# Patient Record
Sex: Female | Born: 1995 | Race: White | Hispanic: No | Smoking: Current every day smoker
Health system: Southern US, Community
[De-identification: ages and names within clinical notes are randomized; demographics above are authoritative.]

## PROBLEM LIST (undated history)

## (undated) DIAGNOSIS — F1990 Other psychoactive substance use, unspecified, uncomplicated: Secondary | ICD-10-CM

## (undated) DIAGNOSIS — R238 Other skin changes: Secondary | ICD-10-CM

## (undated) DIAGNOSIS — R233 Spontaneous ecchymoses: Secondary | ICD-10-CM

## (undated) HISTORY — PX: OTHER SURGICAL HISTORY: SHX169

## (undated) HISTORY — PX: NO PAST SURGERIES: SHX2092

## (undated) HISTORY — DX: Other skin changes: R23.8

## (undated) HISTORY — DX: Other psychoactive substance use, unspecified, uncomplicated: F19.90

## (undated) HISTORY — DX: Spontaneous ecchymoses: R23.3

---
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
## 2015-07-07 ENCOUNTER — Emergency Department
Admission: EM | Admit: 2015-07-07 | Discharge: 2015-07-07 | Discharge status: Against Medical Advice | Payer: Self-pay | Attending: Emergency Medicine | Admitting: Emergency Medicine

## 2015-07-07 ENCOUNTER — Encounter: Payer: Self-pay | Admitting: Emergency Medicine

## 2015-07-07 DIAGNOSIS — T7840XA Allergy, unspecified, initial encounter: Secondary | ICD-10-CM | POA: Insufficient documentation

## 2015-07-07 DIAGNOSIS — X58XXXA Exposure to other specified factors, initial encounter: Secondary | ICD-10-CM | POA: Insufficient documentation

## 2015-07-07 DIAGNOSIS — R220 Localized swelling, mass and lump, head: Secondary | ICD-10-CM | POA: Insufficient documentation

## 2015-07-07 DIAGNOSIS — Y998 Other external cause status: Secondary | ICD-10-CM | POA: Insufficient documentation

## 2015-07-07 DIAGNOSIS — Z720 Tobacco use: Secondary | ICD-10-CM | POA: Insufficient documentation

## 2015-07-07 DIAGNOSIS — Y9389 Activity, other specified: Secondary | ICD-10-CM | POA: Insufficient documentation

## 2015-07-07 DIAGNOSIS — R682 Dry mouth, unspecified: Secondary | ICD-10-CM | POA: Insufficient documentation

## 2015-07-07 DIAGNOSIS — Y9289 Other specified places as the place of occurrence of the external cause: Secondary | ICD-10-CM | POA: Insufficient documentation

## 2015-07-07 NOTE — ED Notes (Signed)
Patient ambulatory to triage with steady gait, without difficulty or distress noted; pt st ?allergic reaction with c/o "dry mouth, feels like the side of mouth is puffy"

## 2016-03-23 ENCOUNTER — Encounter: Payer: Self-pay | Admitting: *Deleted

## 2016-03-23 ENCOUNTER — Emergency Department: Payer: Self-pay

## 2016-03-23 ENCOUNTER — Emergency Department
Admission: EM | Admit: 2016-03-23 | Discharge: 2016-03-23 | Disposition: A | Discharge status: Home / Self Care | Payer: Self-pay | Attending: Emergency Medicine | Admitting: Emergency Medicine

## 2016-03-23 DIAGNOSIS — R109 Unspecified abdominal pain: Secondary | ICD-10-CM | POA: Insufficient documentation

## 2016-03-23 DIAGNOSIS — N390 Urinary tract infection, site not specified: Secondary | ICD-10-CM | POA: Insufficient documentation

## 2016-03-23 DIAGNOSIS — F17210 Nicotine dependence, cigarettes, uncomplicated: Secondary | ICD-10-CM | POA: Insufficient documentation

## 2016-03-23 LAB — COMPREHENSIVE METABOLIC PANEL
ALK PHOS: 61 U/L (ref 38–126)
ALT: 14 U/L (ref 14–54)
ANION GAP: 7 (ref 5–15)
AST: 22 U/L (ref 15–41)
Albumin: 4.5 g/dL (ref 3.5–5.0)
BILIRUBIN TOTAL: 0.7 mg/dL (ref 0.3–1.2)
BUN: 10 mg/dL (ref 6–20)
CALCIUM: 9.2 mg/dL (ref 8.9–10.3)
CO2: 24 mmol/L (ref 22–32)
Chloride: 110 mmol/L (ref 101–111)
Creatinine, Ser: 0.71 mg/dL (ref 0.44–1.00)
GFR calc non Af Amer: >60 mL/min (ref >60)
Glucose, Bld: 100 mg/dL — ABNORMAL HIGH (ref 65–99)
POTASSIUM: 4.1 mmol/L (ref 3.5–5.1)
SODIUM: 141 mmol/L (ref 135–145)
TOTAL PROTEIN: 7.4 g/dL (ref 6.5–8.1)

## 2016-03-23 LAB — URINALYSIS COMPLETE WITH MICROSCOPIC (ARMC ONLY)
BILIRUBIN URINE: NEGATIVE
Bacteria, UA: NONE SEEN
Glucose, UA: NEGATIVE mg/dL
KETONES UR: NEGATIVE mg/dL
LEUKOCYTES UA: NEGATIVE
NITRITE: NEGATIVE
PH: 5 (ref 5.0–8.0)
PROTEIN: NEGATIVE mg/dL
SPECIFIC GRAVITY, URINE: 1.02 (ref 1.005–1.030)

## 2016-03-23 LAB — CBC
HCT: 38.9 % (ref 35.0–47.0)
HEMOGLOBIN: 13.3 g/dL (ref 12.0–16.0)
MCH: 31.7 pg (ref 26.0–34.0)
MCHC: 34.3 g/dL (ref 32.0–36.0)
MCV: 92.4 fL (ref 80.0–100.0)
Platelets: 240 10*3/uL (ref 150–440)
RBC: 4.2 MIL/uL (ref 3.80–5.20)
RDW: 13.7 % (ref 11.5–14.5)
WBC: 5.7 10*3/uL (ref 3.6–11.0)

## 2016-03-23 LAB — POCT PREGNANCY, URINE: PREG TEST UR: NEGATIVE

## 2016-03-23 LAB — LIPASE, BLOOD: Lipase: 43 U/L (ref 11–51)

## 2016-03-23 MED ORDER — TRAMADOL HCL 50 MG PO TABS
ORAL_TABLET | ORAL | Status: AC
  Administered 2016-03-23: 50 mg via ORAL
  Filled 2016-03-23: qty 1

## 2016-03-23 MED ORDER — CIPROFLOXACIN HCL 500 MG PO TABS: 500.0000 mg | ORAL_TABLET | Freq: Two times a day (BID) | ORAL | Status: AC

## 2016-03-23 MED ORDER — TRAMADOL HCL 50 MG PO TABS
50.0000 mg | ORAL_TABLET | Freq: Once | ORAL | Status: AC
  Administered 2016-03-23: 50 mg via ORAL

## 2016-03-23 NOTE — ED Notes (Signed)
States she was seen 3 weeks ago and diagnoised with a kidney infection and given cipro, states bilateral lower back pain that has returned, denies any painful urination

## 2016-03-23 NOTE — ED Provider Notes (Signed)
[facility] Emergency Department Provider Note  ____________________________________________    I have reviewed the triage vital signs and the nursing notes.   HISTORY  Chief Complaint Flank Pain    HPI Bailey Davis is a 20 y.o. female who presents with complaints of urinary frequency and left back pain. Patient reports she was diagnosed with pyelonephritis 3 weeks ago at [facility]. She completed her antibiotics and was feeling much better. Recently she started developing urinary frequency again which was similar to what she had when she was diagnosed with pyelonephritis. She denies fevers or chills. No nausea or vomiting. No abdominal pain. Mild aching in the left lateral back. No vaginal discharge.     History reviewed. No pertinent past medical history.  There are no active problems to display for this patient.   History reviewed. No pertinent past surgical history.  No current outpatient prescriptions on file.  Allergies Review of patient's allergies indicates no known allergies.  History reviewed. No pertinent family history.  Social History Social History  Substance Use Topics  . Smoking status: Current Every Day Smoker -- 0.50 packs/day    Types: Cigarettes  . Smokeless tobacco: None  . Alcohol Use: No    Review of Systems  Constitutional: Negative for fever. Eyes: Negative for redness ENT: Negative for sore throat Cardiovascular: Negative for chest pain Respiratory: Negative for shortness of breath. Gastrointestinal: Negative for abdominal pain Genitourinary: Negative for dysuria.Positive for frequency Musculoskeletal: As above Skin: Negative for rash. Neurological: Negative for focal weakness Psychiatric: no anxiety    ____________________________________________   PHYSICAL EXAM:  VITAL SIGNS: ED Triage Vitals  Enc Vitals Group     BP 03/23/16 0846 116/78 mmHg     Pulse Rate 03/23/16 0846 89     Resp 03/23/16  0846 18     Temp 03/23/16 0846 98.8 F (37.1 C)     Temp Source 03/23/16 0846 Oral     SpO2 03/23/16 0846 98 %     Weight 03/23/16 0846 128 lb (58.06 kg)     Height 03/23/16 0846  (1.575 m)     Head Cir --      Peak Flow --      Pain Score 03/23/16 0846 6     Pain Loc --      Pain Edu? --      Excl. in GC? --      Constitutional: Alert and oriented. Well appearing and in no distress.  Eyes: Conjunctivae are normal. No erythema or injection ENT   Head: Normocephalic and atraumatic.   Mouth/Throat: Mucous membranes are moist. Cardiovascular: Normal rate, regular rhythm. Normal and symmetric distal pulses are present in the upper extremities. Respiratory: Normal respiratory effort without tachypnea nor retractions. Breath sounds are clear and equal bilaterally.  Gastrointestinal: Soft and non-tender in all quadrants. No distention. Mild left CVA tenderness Genitourinary: deferred Musculoskeletal: Nontender with normal range of motion in all extremities.  Neurologic:  Normal speech and language. No gross focal neurologic deficits are appreciated. Skin:  Skin is warm, dry and intact. No rash noted. Psychiatric: Mood and affect are normal. Patient exhibits appropriate insight and judgment.  ____________________________________________    LABS (pertinent positives/negatives)  Labs Reviewed  URINE CULTURE  URINALYSIS COMPLETEWITH MICROSCOPIC ([facility] ONLY)  CBC  COMPREHENSIVE METABOLIC PANEL  LIPASE, BLOOD  POCT PREGNANCY, URINE    ____________________________________________   EKG  None  ____________________________________________    RADIOLOGY  CT scan unremarkable  ____________________________________________   PROCEDURES  Procedure(s) performed: none  Critical Care performed: none  ____________________________________________   INITIAL IMPRESSION / ASSESSMENT AND PLAN / ED COURSE  Pertinent labs & imaging results that were available during  my care of the patient were reviewed by me and considered in my medical decision making (see chart for details).  Patient presents with urinary frequency and mild left CVA tenderness. Vitals are normal. No tachycardia. Tolerating by mouth's. We will check urine, labs and reevaluate  Lab work unremarkable, urine small amount of hemoglobin, we will check CT scan  CT renal stone study negative for stone  On re-eval patient well-appearing and in no distress. We will treat for UTI given her history. Strict return precautions discussed  ____________________________________________   FINAL CLINICAL IMPRESSION(S) / ED DIAGNOSES  Final diagnoses:  Flank pain, acute  UTI (lower urinary tract infection)          Jene Everyobert Kedra Mcglade, MD 03/23/16 1558

## 2016-03-23 NOTE — ED Notes (Signed)
AAOx3.  Skin warm and dry.  NAD.  Ambulates with easy and steady gait.  Moving all extremities equally and strong.  Posture upright and relaxed.

## 2016-03-23 NOTE — ED Notes (Signed)
Return from CT scan.

## 2016-03-23 NOTE — Discharge Instructions (Signed)

## 2016-03-24 LAB — URINE CULTURE

## 2016-04-05 ENCOUNTER — Encounter: Payer: Self-pay | Admitting: Urology

## 2016-04-05 ENCOUNTER — Ambulatory Visit: Payer: Self-pay | Admitting: Urology

## 2017-06-29 ENCOUNTER — Encounter: Payer: Self-pay | Admitting: *Deleted

## 2017-06-29 ENCOUNTER — Emergency Department
Admission: EM | Admit: 2017-06-29 | Discharge: 2017-06-29 | Disposition: A | Discharge status: Home / Self Care | Payer: Self-pay | Attending: Emergency Medicine | Admitting: Emergency Medicine

## 2017-06-29 DIAGNOSIS — F17210 Nicotine dependence, cigarettes, uncomplicated: Secondary | ICD-10-CM | POA: Insufficient documentation

## 2017-06-29 DIAGNOSIS — J0100 Acute maxillary sinusitis, unspecified: Secondary | ICD-10-CM | POA: Insufficient documentation

## 2017-06-29 MED ORDER — AMOXICILLIN 500 MG PO CAPS
500.0000 mg | ORAL_CAPSULE | Freq: Three times a day (TID) | ORAL | 0 refills | Status: DC
Start: 2017-06-29 — End: 2020-02-13

## 2017-06-29 MED ORDER — FEXOFENADINE-PSEUDOEPHED ER 60-120 MG PO TB12: 1.0000 | ORAL_TABLET | Freq: Two times a day (BID) | ORAL | 0 refills | Status: DC

## 2017-06-29 NOTE — ED Triage Notes (Signed)
Pt reports sinus pressure , congestion and drainage for 2-3 weeks, pt denies fevers

## 2017-06-29 NOTE — ED Notes (Signed)
See triage note  States she has had cough,sinus congestion for 3 weeks   States she feels like sxs' are getting worse

## 2017-06-29 NOTE — ED Provider Notes (Signed)
[facility] Emergency Department Provider Note   ____________________________________________   First MD Initiated Contact with Patient 06/29/17 0920     (approximate)  I have reviewed the triage vital signs and the nursing notes.   HISTORY  Chief Complaint Nasal Congestion    HPI Bailey Davis is a 21 y.o. female patient complaining of right sinus pressure and postnasal drainage which has increased over the past 2-3 weeks. Patient state no fever with this complaint. Patient stated pain is worse when she laid down. Supine position increases her coughing . No relief with over-the-counter  medications.  History reviewed. No pertinent past medical history.  There are no active problems to display for this patient.   History reviewed. No pertinent surgical history.  Prior to Admission medications   Medication Sig Start Date End Date Taking? Authorizing Provider  acetaminophen (TYLENOL) 325 MG tablet Take 325-650 mg by mouth every 6 (six) hours as needed for moderate pain.     [provider]  amoxicillin (AMOXIL) 500 MG capsule Take 1 capsule (500 mg total) by mouth 3 (three) times daily. 06/29/17   Joni Reining, PA-C  fexofenadine-pseudoephedrine (ALLEGRA-D) 60-120 MG 12 hr tablet Take 1 tablet by mouth 2 (two) times daily. 06/29/17   Joni Reining, PA-C  omeprazole (PRILOSEC) 20 MG capsule Take 20 mg by mouth daily as needed (for GERD).    [provider]    Allergies Vicodin [hydrocodone-acetaminophen]  No family history on file.  Social History Social History  Substance Use Topics  . Smoking status: Current Every Day Smoker    Packs/day: 0.50    Types: Cigarettes  . Smokeless tobacco: Not on file  . Alcohol use Yes    Review of Systems  Constitutional: No fever/chills Eyes: No visual changes. ENT: No sore throat. Sinus congestion and right ear pain Cardiovascular: Denies chest pain. Respiratory: Denies shortness  of breath. Gastrointestinal: No abdominal pain.  No nausea, no vomiting.  No diarrhea.  No constipation. Genitourinary: Negative for dysuria. Musculoskeletal: Negative for back pain. Skin: Negative for rash. Neurological: Negative for headaches, focal weakness or numbness. Allergic/Immunilogical: Vicodin  ____________________________________________   PHYSICAL EXAM:  VITAL SIGNS: ED Triage Vitals [06/29/17 0902]  Enc Vitals Group     BP 120/75     Pulse Rate 62     Resp 20     Temp 98.9 F (37.2 C)     Temp Source Oral     SpO2 100 %     Weight 126 lb (57.2 kg)     Height  (1.575 m)     Head Circumference      Peak Flow      Pain Score      Pain Loc      Pain Edu?      Excl. in GC?     Constitutional: Alert and oriented. Well appearing and in no acute distress. Nose:Edematous nasal turbinates with thick rhinorrhea. Bilateral maxillary guarding with palpation Mouth/Throat: Mucous membranes are moist.  Oropharynx non-erythematous. Postnasal drainage Neck: No stridor.   Cardiovascular: Normal rate, regular rhythm. Grossly normal heart sounds.  Good peripheral circulation. Respiratory: Normal respiratory effort.  No retractions. Lungs CTAB. Neurologic:  Normal speech and language. No gross focal neurologic deficits are appreciated. No gait instability. Skin:  Skin is warm, dry and intact. No rash noted. Psychiatric: Mood and affect are normal. Speech and behavior are normal.  ____________________________________________   LABS (all labs ordered are listed, but  only abnormal results are displayed)  Labs Reviewed - No data to display ____________________________________________  EKG   ____________________________________________  RADIOLOGY  No results found.  ____________________________________________   PROCEDURES  Procedure(s) performed: None  Procedures  Critical Care performed: No  ____________________________________________   INITIAL  IMPRESSION / ASSESSMENT AND PLAN / ED COURSE  Pertinent labs & imaging results that were available during my care of the patient were reviewed by me and considered in my medical decision making (see chart for details).  Sinus pressure secondary to sinusitis. Patient given discharge care instructions. Patient does take medication as directed. Patient given a work note for today. Advised follow-up with the "clinic if condition persists.      ____________________________________________   FINAL CLINICAL IMPRESSION(S) / ED DIAGNOSES  Final diagnoses:  Acute maxillary sinusitis, recurrence not specified      NEW MEDICATIONS STARTED DURING THIS VISIT:  New Prescriptions   AMOXICILLIN (AMOXIL) 500 MG CAPSULE    Take 1 capsule (500 mg total) by mouth 3 (three) times daily.   FEXOFENADINE-PSEUDOEPHEDRINE (ALLEGRA-D) 60-120 MG 12 HR TABLET    Take 1 tablet by mouth 2 (two) times daily.     Note:  This document was prepared using Dragon voice recognition software and may include unintentional dictation errors.    Joni Reining, PA-C 06/29/17 1610    Phineas Semen, MD 06/29/17 [phone]

## 2017-08-28 ENCOUNTER — Encounter: Payer: Self-pay | Admitting: Emergency Medicine

## 2017-08-28 ENCOUNTER — Other Ambulatory Visit: Payer: Self-pay

## 2017-08-28 DIAGNOSIS — Z79899 Other long term (current) drug therapy: Secondary | ICD-10-CM | POA: Insufficient documentation

## 2017-08-28 DIAGNOSIS — R112 Nausea with vomiting, unspecified: Secondary | ICD-10-CM | POA: Insufficient documentation

## 2017-08-28 DIAGNOSIS — F17210 Nicotine dependence, cigarettes, uncomplicated: Secondary | ICD-10-CM | POA: Insufficient documentation

## 2017-08-28 DIAGNOSIS — E876 Hypokalemia: Secondary | ICD-10-CM | POA: Insufficient documentation

## 2017-08-28 LAB — CBC
HEMATOCRIT: 41.1 % (ref 35.0–47.0)
Hemoglobin: 14.1 g/dL (ref 12.0–16.0)
MCH: 32.3 pg (ref 26.0–34.0)
MCHC: 34.4 g/dL (ref 32.0–36.0)
MCV: 94 fL (ref 80.0–100.0)
PLATELETS: 227 10*3/uL (ref 150–440)
RBC: 4.38 MIL/uL (ref 3.80–5.20)
RDW: 13.6 % (ref 11.5–14.5)
WBC: 6 10*3/uL (ref 3.6–11.0)

## 2017-08-28 LAB — COMPREHENSIVE METABOLIC PANEL
ALBUMIN: 4.8 g/dL (ref 3.5–5.0)
ALT: 14 U/L (ref 14–54)
AST: 26 U/L (ref 15–41)
Alkaline Phosphatase: 63 U/L (ref 38–126)
Anion gap: 11 (ref 5–15)
BUN: 9 mg/dL (ref 6–20)
CHLORIDE: 106 mmol/L (ref 101–111)
CO2: 27 mmol/L (ref 22–32)
CREATININE: 0.72 mg/dL (ref 0.44–1.00)
Calcium: 9.3 mg/dL (ref 8.9–10.3)
GFR calc Af Amer: >60 mL/min (ref >60)
GLUCOSE: 54 mg/dL — AB (ref 65–99)
POTASSIUM: 2.6 mmol/L — AB (ref 3.5–5.1)
Sodium: 144 mmol/L (ref 135–145)
Total Bilirubin: 1.5 mg/dL — ABNORMAL HIGH (ref 0.3–1.2)
Total Protein: 7.4 g/dL (ref 6.5–8.1)

## 2017-08-28 LAB — LIPASE, BLOOD: LIPASE: 67 U/L — AB (ref 11–51)

## 2017-08-28 LAB — POCT PREGNANCY, URINE: Preg Test, Ur: NEGATIVE

## 2017-08-28 NOTE — ED Triage Notes (Signed)
Patient ambulatory to triage with steady gait, without difficulty or distress noted; pt reports N/V/D since Saturday; denies abd pain

## 2017-08-29 ENCOUNTER — Emergency Department
Admission: EM | Admit: 2017-08-29 | Discharge: 2017-08-29 | Disposition: A | Discharge status: Home / Self Care | Payer: Self-pay | Attending: Emergency Medicine | Admitting: Emergency Medicine

## 2017-08-29 DIAGNOSIS — R112 Nausea with vomiting, unspecified: Secondary | ICD-10-CM

## 2017-08-29 DIAGNOSIS — R197 Diarrhea, unspecified: Secondary | ICD-10-CM

## 2017-08-29 DIAGNOSIS — E876 Hypokalemia: Secondary | ICD-10-CM

## 2017-08-29 LAB — URINALYSIS, COMPLETE (UACMP) WITH MICROSCOPIC
BILIRUBIN URINE: NEGATIVE
GLUCOSE, UA: NEGATIVE mg/dL
KETONES UR: 5 mg/dL — AB
Leukocytes, UA: NEGATIVE
NITRITE: NEGATIVE
PH: 5 (ref 5.0–8.0)
Protein, ur: 100 mg/dL — AB
Specific Gravity, Urine: 1.041 — ABNORMAL HIGH (ref 1.005–1.030)

## 2017-08-29 MED ORDER — ONDANSETRON HCL 4 MG PO TABS: ORAL_TABLET | ORAL | 0 refills | Status: DC

## 2017-08-29 MED ORDER — POTASSIUM CHLORIDE CRYS ER 20 MEQ PO TBCR
40.0000 meq | EXTENDED_RELEASE_TABLET | Freq: Once | ORAL | Status: AC
  Administered 2017-08-29: 40 meq via ORAL

## 2017-08-29 MED ORDER — POTASSIUM CHLORIDE CRYS ER 20 MEQ PO TBCR: 20.0000 meq | EXTENDED_RELEASE_TABLET | Freq: Every day | ORAL | 0 refills | Status: DC

## 2017-08-29 MED ORDER — POTASSIUM CHLORIDE CRYS ER 20 MEQ PO TBCR
EXTENDED_RELEASE_TABLET | ORAL | Status: AC
  Administered 2017-08-29: 40 meq via ORAL
  Filled 2017-08-29: qty 2

## 2017-08-29 MED ORDER — POTASSIUM CHLORIDE 10 MEQ/100ML IV SOLN
10.0000 meq | Freq: Once | INTRAVENOUS | Status: AC
  Administered 2017-08-29: 10 meq via INTRAVENOUS
  Filled 2017-08-29: qty 100

## 2017-08-29 MED ORDER — ONDANSETRON HCL 4 MG/2ML IJ SOLN
4.0000 mg | INTRAMUSCULAR | Status: AC
  Administered 2017-08-29: 4 mg via INTRAVENOUS

## 2017-08-29 MED ORDER — SODIUM CHLORIDE 0.9 % IV BOLUS (SEPSIS)
1000.0000 mL | INTRAVENOUS | Status: AC
  Administered 2017-08-29: 1000 mL via INTRAVENOUS

## 2017-08-29 MED ORDER — ONDANSETRON HCL 4 MG/2ML IJ SOLN
INTRAMUSCULAR | Status: AC
  Filled 2017-08-29: qty 2

## 2017-08-29 NOTE — Discharge Instructions (Signed)
We believe your symptoms are caused by either a viral infection or possible a bad food exposure.  Either way, since your symptoms have improved, we feel it is safe for you to go home and follow up with your regular doctor.  Please read the included information and stick to a bland diet for the next two days.  Drink plenty of clear fluids, and if you were provided with a prescription, please take it according to the label instructions.    You have been vomiting enough that you need to take some potassium supplements for a least a few days.  Please read through the included information for more details.  If you develop any new or worsening symptoms, including persistent vomiting not controlled with medication, fever greater than 101, severe or worsening abdominal pain, or other symptoms that concern you, please return immediately to the Emergency Department.

## 2017-08-29 NOTE — ED Provider Notes (Signed)
[facility] Emergency Department Provider Note  ____________________________________________   First MD Initiated Contact with Patient 08/29/17 0013     (approximate)  I have reviewed the triage vital signs and the nursing notes.   HISTORY  Chief Complaint Emesis    HPI Bailey Davis is a 21 y.o. female with no chronic medical issues presents for evaluation of about 48 hours of persistent nausea and vomiting with 3 loose stools as well.  She states that the symptoms started shortly after she ate some homemade chili that was several days old.  She denies having any abdominal pain but reports that the nausea and vomiting have been persistent and severe.  She has been able to tolerate small amounts of liquid and a few crackers or pieces of toast but nothing more substantial than that.  She denies fever/chills, chest pain, shortness of breath, abdominal pain, and dysuria.  She is currently on her menstrual period.  She reports that her sister has also had similar symptoms but more mild and also ate the same chili.  Because her symptoms have persisted and are keeping her from work she felt she should be evaluated.  Nothing in particular makes her symptoms better nor worse.  History reviewed. No pertinent past medical history.  There are no active problems to display for this patient.   History reviewed. No pertinent surgical history.  Prior to Admission medications   Medication Sig Start Date End Date Taking? Authorizing Provider  acetaminophen (TYLENOL) 325 MG tablet Take 325-650 mg by mouth every 6 (six) hours as needed for moderate pain.     [provider]  amoxicillin (AMOXIL) 500 MG capsule Take 1 capsule (500 mg total) by mouth 3 (three) times daily. 06/29/17   Joni ReiningSmith, Ronald K, PA-C  fexofenadine-pseudoephedrine (ALLEGRA-D) 60-120 MG 12 hr tablet Take 1 tablet by mouth 2 (two) times daily. 06/29/17   Joni ReiningSmith, Ronald K, PA-C  omeprazole (PRILOSEC) 20  MG capsule Take 20 mg by mouth daily as needed (for GERD).    [provider]  ondansetron (ZOFRAN) 4 MG tablet Take 1-2 tabs by mouth every 8 hours as needed for nausea/vomiting 08/29/17   Bailey RoseForbach, Baraa Tubbs, MD  potassium chloride SA (KLOR-CON M20) 20 MEQ tablet Take 1 tablet (20 mEq total) by mouth daily. 08/29/17   Bailey RoseForbach, Athalene Kolle, MD    Allergies Vicodin [hydrocodone-acetaminophen]  No family history on file.  Social History Social History   Tobacco Use  . Smoking status: Current Every Day Smoker    Packs/day: 0.50    Types: Cigarettes  . Smokeless tobacco: Never Used  Substance Use Topics  . Alcohol use: Yes  . Drug use: Not on file    Review of Systems Constitutional: No fever/chills Cardiovascular: Denies chest pain. Respiratory: Denies shortness of breath. Gastrointestinal: Persistent severe nausea and vomiting over the last 48 hours.  3 loose stools over the last 48 hours. No abdominal pain.   Genitourinary: Negative for dysuria. +Menstrual period. Musculoskeletal: Negative for neck pain.  Negative for back pain. Integumentary: Negative for rash. Neurological: Negative for headaches, focal weakness or numbness.   ____________________________________________   PHYSICAL EXAM:  VITAL SIGNS: ED Triage Vitals [08/28/17 2328]  Enc Vitals Group     BP 121/77     Pulse Rate 88     Resp 18     Temp 98.2 F (36.8 C)     Temp Source Oral     SpO2 100 %     Weight  56.7 kg (125 lb)     Height 1.575 m (5\' 2" )     Head Circumference      Peak Flow      Pain Score      Pain Loc      Pain Edu?      Excl. in GC?     Constitutional: Alert and oriented. Well appearing and in no acute distress. Eyes: Conjunctivae are normal.  Head: Atraumatic. Nose: No congestion/rhinnorhea. Mouth/Throat: Mucous membranes are tacky but not dry. Neck: No stridor.  No meningeal signs.   Cardiovascular: Normal rate, regular rhythm. Good peripheral circulation. Grossly normal heart  sounds. Respiratory: Normal respiratory effort.  No retractions. Lungs CTAB. Gastrointestinal: Soft and nontender. No distention.  Musculoskeletal: No lower extremity tenderness nor edema. No gross deformities of extremities. Neurologic:  Normal speech and language. No gross focal neurologic deficits are appreciated.  Skin:  Skin is warm, dry and intact. No rash noted. Psychiatric: Mood and affect are normal. Speech and behavior are normal.  ____________________________________________   LABS (all labs ordered are listed, but only abnormal results are displayed)  Labs Reviewed  LIPASE, BLOOD - Abnormal; Notable for the following components:      Result Value   Lipase 67 (*)    All other components within normal limits  COMPREHENSIVE METABOLIC PANEL - Abnormal; Notable for the following components:   Potassium 2.6 (*)    Glucose, Bld 54 (*)    Total Bilirubin 1.5 (*)    All other components within normal limits  URINALYSIS, COMPLETE (UACMP) WITH MICROSCOPIC - Abnormal; Notable for the following components:   Color, Urine AMBER (*)    APPearance CLOUDY (*)    Specific Gravity, Urine 1.041 (*)    Hgb urine dipstick LARGE (*)    Ketones, ur 5 (*)    Protein, ur 100 (*)    Bacteria, UA RARE (*)    Squamous Epithelial / LPF 6-30 (*)    All other components within normal limits  CBC  POCT PREGNANCY, URINE   ____________________________________________  EKG  None - EKG not ordered by ED physician ____________________________________________  RADIOLOGY   No results found.  ____________________________________________   PROCEDURES  Critical Care performed: No   Procedure(s) performed:   Procedures   ____________________________________________   INITIAL IMPRESSION / ASSESSMENT AND PLAN / ED COURSE  As part of my medical decision making, I reviewed the following data within the electronic MEDICAL RECORD NUMBER Nursing notes reviewed and incorporated, Labs reviewed   and Notes from prior ED visits    Differential diagnosis includes, but is not limited to, viral gastroenteritis, gastritis, food poisoning, intra-abdominal infection including but not limited to appendicitis, biliary disease, etc.  Lab work is notable for a metabolic panel that is within normal limits except for hypokalemia and hypoglycemia but the patient is alert and oriented and ambulatory without any difficulty.  Lipase is only slightly elevated at 67 and the patient has no abdominal tenderness to palpation in all.  her urinalysis shows RBCs but she is otherwise asymptomatic and it is likely the result of being on her period.  Based on her evaluation and history, I think most likely she is suffering from viral gastroenteritis versus food poisoning.  She is able to tolerate some oral intake although relatively minimal.  I will replete her potassium with an oral supplement as well as 10 mEq by IV.  After some Zofran we will try p.o. challenge, particularly given her hypoglycemia.  If she is  able to tolerate p.o. intake I anticipate discharging her with Zofran and my usual return precautions.  She understands and agrees with the plan.  Clinical Course as of Aug 29 245  Wed Aug 29, 2017  1610 Patient felt better, ate a significant amount of food, no trouble tolerating PO.  No indication for acute intraabdominal pathology.  Will discharge with Zofran and potassium supplement.  Gave usual/customary return precautions.  [CF]    Clinical Course User Index [CF] Bailey Rose, MD    ____________________________________________  FINAL CLINICAL IMPRESSION(S) / ED DIAGNOSES  Final diagnoses:  Nausea vomiting and diarrhea  Hypokalemia, gastrointestinal losses     MEDICATIONS GIVEN DURING THIS VISIT:  Medications  sodium chloride 0.9 % bolus 1,000 mL (0 mLs Intravenous Stopped 08/29/17 0220)  potassium chloride SA (K-DUR,KLOR-CON) CR tablet 40 mEq (40 mEq Oral Given 08/29/17 0038)  potassium  chloride 10 mEq in 100 mL IVPB (0 mEq Intravenous Stopped 08/29/17 0151)  ondansetron (ZOFRAN) injection 4 mg (4 mg Intravenous Given 08/29/17 0036)     ED Discharge Orders        Ordered    ondansetron (ZOFRAN) 4 MG tablet     08/29/17 0242    potassium chloride SA (KLOR-CON M20) 20 MEQ tablet  Daily     08/29/17 0242       Note:  This document was prepared using Dragon voice recognition software and may include unintentional dictation errors.    Bailey Rose, MD 08/29/17 [phone]

## 2018-07-03 ENCOUNTER — Other Ambulatory Visit: Payer: Self-pay

## 2018-07-03 ENCOUNTER — Encounter: Payer: Self-pay | Admitting: *Deleted

## 2018-07-03 ENCOUNTER — Emergency Department
Admission: EM | Admit: 2018-07-03 | Discharge: 2018-07-03 | Disposition: A | Discharge status: Home / Self Care | Payer: Self-pay | Attending: Emergency Medicine | Admitting: Emergency Medicine

## 2018-07-03 DIAGNOSIS — Y929 Unspecified place or not applicable: Secondary | ICD-10-CM | POA: Insufficient documentation

## 2018-07-03 DIAGNOSIS — S51811A Laceration without foreign body of right forearm, initial encounter: Secondary | ICD-10-CM | POA: Insufficient documentation

## 2018-07-03 DIAGNOSIS — Y9389 Activity, other specified: Secondary | ICD-10-CM | POA: Insufficient documentation

## 2018-07-03 DIAGNOSIS — F17210 Nicotine dependence, cigarettes, uncomplicated: Secondary | ICD-10-CM | POA: Insufficient documentation

## 2018-07-03 DIAGNOSIS — W25XXXA Contact with sharp glass, initial encounter: Secondary | ICD-10-CM | POA: Insufficient documentation

## 2018-07-03 DIAGNOSIS — Y999 Unspecified external cause status: Secondary | ICD-10-CM | POA: Insufficient documentation

## 2018-07-03 MED ORDER — LIDOCAINE HCL (PF) 1 % IJ SOLN
INTRAMUSCULAR | Status: AC
  Administered 2018-07-03: 5 mL
  Filled 2018-07-03: qty 5

## 2018-07-03 MED ORDER — LIDOCAINE HCL (PF) 1 % IJ SOLN
5.0000 mL | Freq: Once | INTRAMUSCULAR | Status: AC
  Administered 2018-07-03: 5 mL

## 2018-07-03 NOTE — ED Notes (Signed)
See triage note  Presents with lacerations noted to right forearm  States she broke her front door glass  Has 2 lacerations to f/a with a small puncture wound
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
## 2018-07-03 NOTE — Discharge Instructions (Signed)
Do not get the sutured area wet for 24 hours. After 24 hours, shower/bathe as usual and pat the area dry. °Change the bandage 2 times per day and apply antibiotic ointment. °Leave open to air when at no risk of getting the area dirty, but cover at night before bed. °See your PCP or go to Urgent Care in 10 days for suture removal or sooner for signs or concern of infection. ° °

## 2018-12-03 ENCOUNTER — Encounter: Payer: Self-pay | Admitting: Emergency Medicine

## 2018-12-03 ENCOUNTER — Emergency Department
Admission: EM | Admit: 2018-12-03 | Discharge: 2018-12-03 | Disposition: A | Discharge status: Home / Self Care | Payer: 59 | Attending: Student in an Organized Health Care Education/Training Program | Admitting: Student in an Organized Health Care Education/Training Program

## 2018-12-03 ENCOUNTER — Other Ambulatory Visit: Payer: Self-pay

## 2018-12-03 DIAGNOSIS — F17210 Nicotine dependence, cigarettes, uncomplicated: Secondary | ICD-10-CM | POA: Diagnosis not present

## 2018-12-03 DIAGNOSIS — F1510 Other stimulant abuse, uncomplicated: Secondary | ICD-10-CM | POA: Diagnosis not present

## 2018-12-03 DIAGNOSIS — F1910 Other psychoactive substance abuse, uncomplicated: Secondary | ICD-10-CM | POA: Diagnosis not present

## 2018-12-03 DIAGNOSIS — R45851 Suicidal ideations: Secondary | ICD-10-CM | POA: Diagnosis not present

## 2018-12-03 DIAGNOSIS — Z5900 Homelessness unspecified: Secondary | ICD-10-CM

## 2018-12-03 LAB — URINE DRUG SCREEN, QUALITATIVE (ARMC ONLY)
Amphetamines, Ur Screen: POSITIVE — AB
BARBITURATES, UR SCREEN: NOT DETECTED
BENZODIAZEPINE, UR SCRN: NOT DETECTED
Cannabinoid 50 Ng, Ur ~~LOC~~: NOT DETECTED
Cocaine Metabolite,Ur ~~LOC~~: NOT DETECTED
MDMA (Ecstasy)Ur Screen: NOT DETECTED
Methadone Scn, Ur: NOT DETECTED
Opiate, Ur Screen: NOT DETECTED
Phencyclidine (PCP) Ur S: NOT DETECTED
Tricyclic, Ur Screen: NOT DETECTED

## 2018-12-03 LAB — COMPREHENSIVE METABOLIC PANEL
ALBUMIN: 4.1 g/dL (ref 3.5–5.0)
ALT: 16 U/L (ref 0–44)
ANION GAP: 7 (ref 5–15)
AST: 26 U/L (ref 15–41)
Alkaline Phosphatase: 64 U/L (ref 38–126)
BILIRUBIN TOTAL: 1.3 mg/dL — AB (ref 0.3–1.2)
BUN: 9 mg/dL (ref 6–20)
CO2: 26 mmol/L (ref 22–32)
Calcium: 8.8 mg/dL — ABNORMAL LOW (ref 8.9–10.3)
Chloride: 107 mmol/L (ref 98–111)
Creatinine, Ser: 0.56 mg/dL (ref 0.44–1.00)
GFR calc Af Amer: >60 mL/min (ref >60)
GFR calc non Af Amer: >60 mL/min (ref >60)
Glucose, Bld: 93 mg/dL (ref 70–99)
Potassium: 2.8 mmol/L — ABNORMAL LOW (ref 3.5–5.1)
Sodium: 140 mmol/L (ref 135–145)
Total Protein: 6.9 g/dL (ref 6.5–8.1)

## 2018-12-03 LAB — ETHANOL: Alcohol, Ethyl (B): <10 mg/dL (ref <10)

## 2018-12-03 LAB — CBC
HCT: 40.1 % (ref 36.0–46.0)
Hemoglobin: 12.9 g/dL (ref 12.0–15.0)
MCH: 30.1 pg (ref 26.0–34.0)
MCHC: 32.2 g/dL (ref 30.0–36.0)
MCV: 93.5 fL (ref 80.0–100.0)
Platelets: 266 10*3/uL (ref 150–400)
RBC: 4.29 MIL/uL (ref 3.87–5.11)
RDW: 13.1 % (ref 11.5–15.5)
WBC: 9.3 10*3/uL (ref 4.0–10.5)
nRBC: 0 % (ref 0.0–0.2)

## 2018-12-03 LAB — SALICYLATE LEVEL: Salicylate Lvl: <7 mg/dL (ref 2.8–30.0)

## 2018-12-03 LAB — ACETAMINOPHEN LEVEL: Acetaminophen (Tylenol), Serum: <10 ug/mL — ABNORMAL LOW (ref 10–30)

## 2018-12-03 LAB — POCT PREGNANCY, URINE: Preg Test, Ur: NEGATIVE

## 2018-12-03 NOTE — ED Notes (Signed)
Dr. Maurer at bedside. 

## 2018-12-03 NOTE — ED Notes (Signed)
Dinner tray placed in patient's room. 
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
## 2018-12-03 NOTE — ED Notes (Signed)
Pt given hygiene supplies and is taking a shower.  

## 2018-12-03 NOTE — ED Provider Notes (Signed)
[facility] Emergency Department Provider Note    First MD Initiated Contact with Patient 12/03/18 1341     (approximate)  I have reviewed the triage vital signs and the nursing notes.   HISTORY  Chief Complaint Psychiatric Evaluation    HPI Bailey Davis is a 23 y.o. female who is homeless presents the ER under IVC due to telling her father that she wants to kill herself.  Patient denies any SI at this time.  Denies any chest pain shortness of breath nausea or vomiting.    History reviewed. No pertinent past medical history. No family history on file. History reviewed. No pertinent surgical history. There are no active problems to display for this patient.     Prior to Admission medications   Medication Sig Start Date End Date Taking? Authorizing Provider  acetaminophen (TYLENOL) 325 MG tablet Take 325-650 mg by mouth every 6 (six) hours as needed for moderate pain.     [provider]  amoxicillin (AMOXIL) 500 MG capsule Take 1 capsule (500 mg total) by mouth 3 (three) times daily. 06/29/17   Joni Reining, PA-C  fexofenadine-pseudoephedrine (ALLEGRA-D) 60-120 MG 12 hr tablet Take 1 tablet by mouth 2 (two) times daily. 06/29/17   Joni Reining, PA-C  omeprazole (PRILOSEC) 20 MG capsule Take 20 mg by mouth daily as needed (for GERD).    [provider]  ondansetron (ZOFRAN) 4 MG tablet Take 1-2 tabs by mouth every 8 hours as needed for nausea/vomiting 08/29/17   Loleta Rose, MD  potassium chloride SA (KLOR-CON M20) 20 MEQ tablet Take 1 tablet (20 mEq total) by mouth daily. 08/29/17   Loleta Rose, MD    Allergies Other and Vicodin [hydrocodone-acetaminophen]    Social History Social History   Tobacco Use  . Smoking status: Current Every Day Smoker    Packs/day: 0.50    Types: Cigarettes  . Smokeless tobacco: Never Used  Substance Use Topics  . Alcohol use: Yes  . Drug use: Yes    Types: Marijuana,  Methamphetamines    Review of Systems Patient denies headaches, rhinorrhea, blurry vision, numbness, shortness of breath, chest pain, edema, cough, abdominal pain, nausea, vomiting, diarrhea, dysuria, fevers, rashes or hallucinations unless otherwise stated above in HPI. ____________________________________________   PHYSICAL EXAM:  VITAL SIGNS: Vitals:   12/03/18 1319  BP: 126/80  Pulse: 92  Resp: 18  Temp: 98.9 F (37.2 C)  SpO2: 100%    Constitutional: Alert and oriented.  Eyes: Conjunctivae are normal.  Head: Atraumatic. Nose: No congestion/rhinnorhea. Mouth/Throat: Mucous membranes are moist.   Neck: No stridor. Painless ROM.  Cardiovascular: Normal rate, regular rhythm. Grossly normal heart sounds.  Good peripheral circulation. Respiratory: Normal respiratory effort.  No retractions. Lungs CTAB. Gastrointestinal: Soft and nontender. No distention. No abdominal bruits. No CVA tenderness. Genitourinary:  Musculoskeletal: No lower extremity tenderness nor edema.  No joint effusions. Neurologic:  Normal speech and language. No gross focal neurologic deficits are appreciated. No facial droop Skin:  Skin is warm, dry and intact. No rash noted. Psychiatric: Mood and affect are normal. Speech and behavior are normal.  ____________________________________________   LABS (all labs ordered are listed, but only abnormal results are displayed)  Results for orders placed or performed during the hospital encounter of 12/03/18 (from the past 24 hour(s))  Comprehensive metabolic panel     Status: Abnormal   Collection Time: 12/03/18  1:33 PM  Result Value Ref Range   Sodium 140 135 -  145 mmol/L   Potassium 2.8 (L) 3.5 - 5.1 mmol/L   Chloride 107 98 - 111 mmol/L   CO2 26 22 - 32 mmol/L   Glucose, Bld 93 70 - 99 mg/dL   BUN 9 6 - 20 mg/dL   Creatinine, Ser 2.950.56 0.44 - 1.00 mg/dL   Calcium 8.8 (L) 8.9 - 10.3 mg/dL   Total Protein 6.9 6.5 - 8.1 g/dL   Albumin 4.1 3.5 - 5.0 g/dL    AST 26 15 - 41 U/L   ALT 16 0 - 44 U/L   Alkaline Phosphatase 64 38 - 126 U/L   Total Bilirubin 1.3 (H) 0.3 - 1.2 mg/dL   GFR calc non Af Amer >60 >60 mL/min   GFR calc Af Amer >60 >60 mL/min   Anion gap 7 5 - 15  Ethanol     Status: None   Collection Time: 12/03/18  1:33 PM  Result Value Ref Range   Alcohol, Ethyl (B) <10 <10 mg/dL  Salicylate level     Status: None   Collection Time: 12/03/18  1:33 PM  Result Value Ref Range   Salicylate Lvl <7.0 2.8 - 30.0 mg/dL  Acetaminophen level     Status: Abnormal   Collection Time: 12/03/18  1:33 PM  Result Value Ref Range   Acetaminophen (Tylenol), Serum <10 (L) 10 - 30 ug/mL  cbc     Status: None   Collection Time: 12/03/18  1:33 PM  Result Value Ref Range   WBC 9.3 4.0 - 10.5 K/uL   RBC 4.29 3.87 - 5.11 MIL/uL   Hemoglobin 12.9 12.0 - 15.0 g/dL   HCT 62.140.1 30.836.0 - 65.746.0 %   MCV 93.5 80.0 - 100.0 fL   MCH 30.1 26.0 - 34.0 pg   MCHC 32.2 30.0 - 36.0 g/dL   RDW 84.613.1 96.211.5 - 95.215.5 %   Platelets 266 150 - 400 K/uL   nRBC 0.0 0.0 - 0.2 %   ____________________________________________ ________________________________  RADIOLOGY  I personally reviewed all radiographic images ordered to evaluate for the above acute complaints and reviewed radiology reports and findings.  These findings were personally discussed with the patient.  Please see medical record for radiology report.  ____________________________________________   PROCEDURES  Procedure(s) performed:  Procedures    Critical Care performed: no ____________________________________________   INITIAL IMPRESSION / ASSESSMENT AND PLAN / ED COURSE  Pertinent labs & imaging results that were available during my care of the patient were reviewed by me and considered in my medical decision making (see chart for details).   DDX: Psychosis, delirium, medication effect, noncompliance, polysubstance abuse, Si, Hi, depression   Bailey Davis is a 23 y.o. who presents to the  ED with for evaluation of SI. Marland Kitchen.  Laboratory testing was ordered to evaluation for underlying electrolyte derangement or signs of underlying organic pathology to explain today's presentation.  Based on history and physical and laboratory evaluation, it appears that the patient's presentation is 2/2 underlying psychiatric disorder and will require further evaluation and management by inpatient psychiatry.  Patient was  made an IVC due to plan of SI andhomelessness.  Disposition pending psychiatric evaluation.       As part of my medical decision making, I reviewed the following data within the electronic MEDICAL RECORD NUMBER Nursing notes reviewed and incorporated, Labs reviewed, notes from prior ED visits and [place] Controlled Substance Database   ____________________________________________   FINAL CLINICAL IMPRESSION(S) / ED DIAGNOSES  Final diagnoses:  Suicidal ideation  Homelessness      NEW MEDICATIONS STARTED DURING THIS VISIT:  New Prescriptions   No medications on file     Note:  This document was prepared using Dragon voice recognition software and may include unintentional dictation errors.    Willy Eddy, MD 12/03/18 1435

## 2018-12-03 NOTE — ED Notes (Signed)
RN received phone call from [employer] (patient's father).  Mr. Seese is upset his daughter is being released because "she is supposed to be on a 72 hour hold."  Caller hung up on RN.

## 2018-12-03 NOTE — ED Triage Notes (Signed)
Pt presents to ED via [employer] under active IVC papers. Per papers pt is homeless and told her father she was going to kill herself. Pt denies active SI and HI at this time, Pt states "my father is just being a petty asshole right now." Pt states she is afraid of death.

## 2018-12-03 NOTE — ED Notes (Signed)
Patient assigned to appropriate care area. Patient oriented to unit/care area: Informed that, for their safety, care areas are designed for safety and monitored by security cameras at all times.  Patient verbalizes understanding, and verbal contract for safety obtained.

## 2018-12-03 NOTE — ED Notes (Signed)
IVC  PAPERS  RESCINDED  PER  DR  Viviano Simas MD  INFORMED  AMY RN

## 2018-12-03 NOTE — ED Notes (Signed)

## 2018-12-03 NOTE — ED Notes (Addendum)
1 yellow ring with clear stone, 1 gray ring with pink stone, 1 gray necklace, 1 gray necklace with heart charm, 1 pair nipple rings, 1 belly button ring, 1 braided cloth ankle bracelet with red stones. 1 hair tie, 1 pair tennis shoes, 1 pair blue jeans, 1 pair socks, 1 black apron, 1 shirt, 1 bra, 1 camo jacket, 1 cell phone, 1 cell phone charger with purple portable batter, 2 lighters, 1 pen, 1 note pad.

## 2019-02-28 ENCOUNTER — Encounter: Payer: Self-pay | Admitting: Emergency Medicine

## 2019-02-28 ENCOUNTER — Other Ambulatory Visit: Payer: Self-pay

## 2019-02-28 ENCOUNTER — Emergency Department
Admission: EM | Admit: 2019-02-28 | Discharge: 2019-02-28 | Disposition: A | Discharge status: Home / Self Care | Payer: 59 | Attending: Emergency Medicine | Admitting: Emergency Medicine

## 2019-02-28 DIAGNOSIS — F17210 Nicotine dependence, cigarettes, uncomplicated: Secondary | ICD-10-CM | POA: Insufficient documentation

## 2019-02-28 DIAGNOSIS — H9201 Otalgia, right ear: Secondary | ICD-10-CM | POA: Insufficient documentation

## 2019-02-28 DIAGNOSIS — Z79899 Other long term (current) drug therapy: Secondary | ICD-10-CM | POA: Diagnosis not present

## 2019-02-28 MED ORDER — CEPHALEXIN 500 MG PO CAPS: 500.0000 mg | ORAL_CAPSULE | Freq: Three times a day (TID) | ORAL | 0 refills | Status: AC

## 2019-02-28 NOTE — ED Triage Notes (Signed)
Patient ambulatory to triage with steady gait, without difficulty or distress noted; pt reports right earache since last night with no recent illness

## 2019-02-28 NOTE — Discharge Instructions (Addendum)
I suggest that you do not try to get anything into your ear including Q-tips into you can see the ENT doctor.  Please call him first thing in the morning today for an appointment and tell them you are seen in the emergency room.  They hopefully can work him in.  If you have increased pain, fever, drainage from your ear or other concerns return to the emergency department.  Again, I would like to emphasize please do not place anything else in your ear.  Take Tylenol or Motrin for the discomfort as tolerated and directed assuming you have no allergies to these medications.

## 2019-02-28 NOTE — ED Provider Notes (Addendum)
[facility] Emergency Department Provider Note  ____________________________________________   I have reviewed the triage vital signs and the nursing notes. Where available I have reviewed prior notes and, if possible and indicated, outside hospital notes.    HISTORY  Chief Complaint Otalgia    HPI Bailey Davis is a 23 y.o. female states she has been had a feeling that there was something in her ear for the last week or so, it is somewhat tender now.  No fever no other symptoms.  She is tried to date out with a Q-tip and is unable to do it.  No change in hearing, no redness or swelling.  No foreign body known to be in there.  Did try to use Q-tips.  [place] not been in the [place]  Worried there might be a tick there. Patient is also been using tweezers in attempt to extricate what she perceives to be something in there.  Patient seen and evaluated during the coronavirus epidemic during a time with low staffing   History reviewed. No pertinent past medical history.  Patient Active Problem List   Diagnosis Date Noted  . 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 No pertinent surgical history.  Prior to Admission medications   Medication Sig Start Date End Date Taking? Authorizing Provider  acetaminophen (TYLENOL) 325 MG tablet Take 325-650 mg by mouth every 6 (six) hours as needed for moderate pain.     [provider]  amoxicillin (AMOXIL) 500 MG capsule Take 1 capsule (500 mg total) by mouth 3 (three) times daily. 06/29/17   Joni ReiningSmith, Ronald K, PA-C  fexofenadine-pseudoephedrine (ALLEGRA-D) 60-120 MG 12 hr tablet Take 1 tablet by mouth 2 (two) times daily. 06/29/17   Joni ReiningSmith, Ronald K, PA-C  omeprazole (PRILOSEC) 20 MG capsule Take 20 mg by mouth daily as needed (for GERD).    [provider]  ondansetron (ZOFRAN) 4 MG tablet Take 1-2 tabs by mouth every 8 hours as needed for nausea/vomiting 08/29/17    Loleta RoseForbach, Cory, MD  potassium chloride SA (KLOR-CON M20) 20 MEQ tablet Take 1 tablet (20 mEq total) by mouth daily. 08/29/17   Loleta RoseForbach, Cory, MD    Allergies Other and Vicodin [hydrocodone-acetaminophen]  No family history on file.  Social History Social History   Tobacco Use  . Smoking status: Current Every Day Smoker    Packs/day: 0.50    Types: Cigarettes  . Smokeless tobacco: Never Used  Substance Use Topics  . Alcohol use: Yes  . Drug use: Yes    Types: Marijuana, Methamphetamines    Review of Systems No fever no systemic illness   ____________________________________________   PHYSICAL EXAM:  VITAL SIGNS: ED Triage Vitals  Enc Vitals Group     BP 02/28/19 0345 118/75     Pulse Rate 02/28/19 0345 87     Resp 02/28/19 0345 16     Temp 02/28/19 0345 97.8 F (36.6 C)     Temp Source 02/28/19 0345 Oral     SpO2 02/28/19 0345 99 %     Weight 02/28/19 0342 115 lb (52.2 kg)     Height 02/28/19 0342 5\' 2"  (1.575 m)     Head Circumference --      Peak Flow --      Pain Score 02/28/19 0342 7     Pain Loc --      Pain Edu? --      Excl. in GC? --  Constitutional: Alert and oriented. Well appearing and in no acute distress. Eyes: Conjunctivae are normal Head: Atraumatic HEENT: No congestion/rhinnorhea. Mucous membranes are moist.  Oropharynx non-erythematous TM on the right is not erythematous there is no loss of landmarks however in the ear canal itself close to the TM there is an area of raised semi-dome erythema consistent with either an engorged tick or a blood blister.  I did not is cellulitis or infectious changes to the ear canal itself nor do I see any leg appendages that I would be more consistent with a tic.  I do favor most likely hematoma. Skin:  Skin is warm, dry and intact. No rash noted. Psychiatric: Mood and affect are normal. Speech and behavior are normal.  ____________________________________________   LABS (all labs ordered are listed,  but only abnormal results are displayed)  Labs Reviewed - No data to display  Pertinent labs  results that were available during my care of the patient were reviewed by me and considered in my medical decision making (see chart for details). ____________________________________________  EKG  I personally interpreted any EKGs ordered by me or triage  ____________________________________________  RADIOLOGY  Pertinent labs & imaging results that were available during my care of the patient were reviewed by me and considered in my medical decision making (see chart for details). If possible, patient and/or family made aware of any abnormal findings.  No results found. ____________________________________________    PROCEDURES  Procedure(s) performed: None  Procedures  Critical Care performed: None  ____________________________________________   INITIAL IMPRESSION / ASSESSMENT AND PLAN / ED COURSE  Pertinent labs & imaging results that were available during my care of the patient were reviewed by me and considered in my medical decision making (see chart for details).  With a sensation of foreign body in her ear, she is actually been digging around and there were lots of different objects so some of this could be trauma from her multiple attempts to get out what she perceives to be a foreign body which may or may not still be there certainly is possible that she could irritate the ear try to get something out of there which no longer is there and now have residual discomfort from the trauma of the extrication itself.  In any event, we will put viscous lidocaine and and see if we can numb it up and I will further investigate with alligator forceps to investigate the nature of the entityvisualized by otoscope.  ----------------------------------------- 5:53 AM on 02/28/2019 ----------------------------------------- Patient resting comfortably, there is no evidence of arthropod that  I can see, after the administration of lidocaine.  I was able to investigate with a Q-tip and it does appear to be a small hematoma.  Most likely from patient's investigations of her ear.  I have advised her to stop for any foreign bodies in her ear, we will have her follow-up with ENT.  There is some slight erythema around the lesion, and I will send her home on a few days of antibiotics pending ENT evaluation.  Return precautions follow-up given and understood.  Patient is comfortable with this plan.     ____________________________________________   FINAL CLINICAL IMPRESSION(S) / ED DIAGNOSES  Final diagnoses:  None      This chart was dictated using voice recognition software.  Despite best efforts to proofread,  errors can occur which can change meaning.      Jeanmarie Plant, MD 02/28/19 Jeralyn Bennett    Jeanmarie Plant, MD 02/28/19  2952    Jeanmarie Plant, MD 02/28/19 [phone]

## 2019-06-05 ENCOUNTER — Encounter: Payer: Self-pay | Admitting: Emergency Medicine

## 2019-06-05 DIAGNOSIS — L02411 Cutaneous abscess of right axilla: Secondary | ICD-10-CM | POA: Insufficient documentation

## 2019-06-05 DIAGNOSIS — F17210 Nicotine dependence, cigarettes, uncomplicated: Secondary | ICD-10-CM | POA: Insufficient documentation

## 2019-06-05 MED ORDER — IBUPROFEN 400 MG PO TABS
400.0000 mg | ORAL_TABLET | Freq: Once | ORAL | Status: AC | PRN
  Administered 2019-06-05: 400 mg via ORAL
  Filled 2019-06-05: qty 1

## 2019-06-05 NOTE — ED Triage Notes (Signed)
Pt reports raised area under the right arm in axillary x4 days, worsening in last 24 hours. Pt denies fever as well as drainage. Area is red with white area at center.

## 2019-06-06 ENCOUNTER — Emergency Department
Admission: EM | Admit: 2019-06-06 | Discharge: 2019-06-06 | Disposition: A | Discharge status: Home / Self Care | Payer: 59 | Attending: Emergency Medicine | Admitting: Emergency Medicine

## 2019-06-06 DIAGNOSIS — L02411 Cutaneous abscess of right axilla: Secondary | ICD-10-CM

## 2019-06-06 MED ORDER — SULFAMETHOXAZOLE-TRIMETHOPRIM 800-160 MG PO TABS: 2.0000 | ORAL_TABLET | Freq: Two times a day (BID) | ORAL | 0 refills | Status: DC

## 2019-06-06 MED ORDER — OXYCODONE-ACETAMINOPHEN 5-325 MG PO TABS
2.0000 | ORAL_TABLET | Freq: Once | ORAL | Status: AC
  Administered 2019-06-06: 2 via ORAL
  Filled 2019-06-06: qty 2

## 2019-06-06 MED ORDER — OXYCODONE-ACETAMINOPHEN 5-325 MG PO TABS: 2.0000 | ORAL_TABLET | Freq: Four times a day (QID) | ORAL | 0 refills | Status: DC | PRN

## 2019-06-06 MED ORDER — SULFAMETHOXAZOLE-TRIMETHOPRIM 800-160 MG PO TABS
2.0000 | ORAL_TABLET | Freq: Once | ORAL | Status: AC
  Administered 2019-06-06: 2 via ORAL
  Filled 2019-06-06: qty 2

## 2019-06-06 MED ORDER — CEPHALEXIN 500 MG PO CAPS
500.0000 mg | ORAL_CAPSULE | Freq: Once | ORAL | Status: AC
  Administered 2019-06-06: 04:00:00 500 mg via ORAL
  Filled 2019-06-06: qty 1

## 2019-06-06 MED ORDER — LIDOCAINE HCL (PF) 1 % IJ SOLN
10.0000 mL | Freq: Once | INTRAMUSCULAR | Status: AC
  Administered 2019-06-06: 10 mL
  Filled 2019-06-06: qty 10

## 2019-06-06 MED ORDER — CEPHALEXIN 500 MG PO CAPS: 500.0000 mg | ORAL_CAPSULE | Freq: Four times a day (QID) | ORAL | 0 refills | Status: DC

## 2019-06-06 NOTE — Discharge Instructions (Addendum)
You have been seen in the Emergency Department (ED) today for an abscess.  This was drained in the ED and we are starting you on antibiotics.  Please complete the full course of treatment.  Read through the additional discharge instructions included below regarding wound care recommendations.  Keep the wound clean and dry, though you may wash as you would normally.  Change the dressing twice daily.  Call your doctor sooner or return to the ED if you develop worsening signs of infection such as: increased redness, increased pain, pus, or fever.

## 2019-06-06 NOTE — ED Provider Notes (Signed)
[facility] Emergency Department Provider Note  ____________________________________________   First MD Initiated Contact with Patient 06/06/19 [phone]     (approximate)  I have reviewed the triage vital signs and the nursing notes.   HISTORY  Chief Complaint Abscess    HPI Bailey Davis is a [age] y.o. female  with no contributory past medical history  who presents for gradually worsening swelling and pain in her right armpit for the last 4 days.  She says it is gotten significantly worse over the last 24 hours with some redness that extends down below the lesion.  It is about the size of a golf ball.  She has never had these sorts of issues in the past.  Nothing particular makes it better and any amount of pressure are moving around her right arm makes it worse.  She describes the pain is severe.  She denies fever/chills, sore throat, chest pain, shortness of breath, nausea, vomiting, and abdominal pain.  She denies any IV drug use.  She shaves her armpits and thinks it may have started as an ingrown hair.  No numbness or tingling in the arm.  No drainage from the lesion.        History reviewed. No pertinent past medical history.  Patient Active Problem List   Diagnosis Date Noted  . 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 No pertinent surgical history.  Prior to Admission medications   Medication Sig Start Date End Date Taking? Authorizing Provider  acetaminophen (TYLENOL) 325 MG tablet Take 325-650 mg by mouth every 6 (six) hours as needed for moderate pain.     [provider]  amoxicillin (AMOXIL) 500 MG capsule Take 1 capsule (500 mg total) by mouth 3 (three) times daily. Patient not taking: Reported on 02/28/2019 06/29/17   Joni ReiningSmith, Ronald K, PA-C  cephALEXin (KEFLEX) 500 MG capsule Take 1 capsule (500 mg total) by mouth 4 (four) times daily. 06/06/19   Loleta RoseForbach, Basel Defalco, MD  fexofenadine-pseudoephedrine (ALLEGRA-D) 60-120 MG  12 hr tablet Take 1 tablet by mouth 2 (two) times daily. Patient not taking: Reported on 02/28/2019 06/29/17   Joni ReiningSmith, Ronald K, PA-C  omeprazole (PRILOSEC) 20 MG capsule Take 20 mg by mouth daily as needed (for GERD).    [provider]  ondansetron (ZOFRAN) 4 MG tablet Take 1-2 tabs by mouth every 8 hours as needed for nausea/vomiting Patient not taking: Reported on 02/28/2019 08/29/17   Loleta RoseForbach, Lamoyne Palencia, MD  oxyCODONE-acetaminophen (PERCOCET) 5-325 MG tablet Take 2 tablets by mouth every 6 (six) hours as needed for severe pain. 06/06/19   Loleta RoseForbach, Courtney Fenlon, MD  potassium chloride SA (KLOR-CON M20) 20 MEQ tablet Take 1 tablet (20 mEq total) by mouth daily. Patient not taking: Reported on 02/28/2019 08/29/17   Loleta RoseForbach, Neev Mcmains, MD  sulfamethoxazole-trimethoprim (BACTRIM DS) 800-160 MG tablet Take 2 tablets by mouth 2 (two) times daily. 06/06/19   Loleta RoseForbach, Natalija Mavis, MD    Allergies Other and Vicodin [hydrocodone-acetaminophen]  History reviewed. No pertinent family history.  Social History Social History   Tobacco Use  . Smoking status: Current Every Day Smoker    Packs/day: 0.50    Types: Cigarettes  . Smokeless tobacco: Never Used  Substance Use Topics  . Alcohol use: Yes  . Drug use: Yes    Types: Marijuana, Methamphetamines    Review of Systems Constitutional: No fever/chills Eyes: No visual changes. ENT: No sore throat. Cardiovascular: Denies chest pain. Respiratory: Denies shortness of breath. Gastrointestinal: No abdominal  pain.  No nausea, no vomiting.  No diarrhea.  No constipation. Genitourinary: Negative for dysuria. Musculoskeletal: Negative for neck pain.  Negative for back pain. Integumentary: Golf ball sized area of swelling and redness under the right armpit with some surrounding redness x4 days Neurological: Negative for headaches, focal weakness or numbness.   ____________________________________________   PHYSICAL EXAM:  VITAL SIGNS: ED Triage Vitals  Enc  Vitals Group     BP 06/05/19 2218 118/79     Pulse Rate 06/05/19 2218 82     Resp 06/05/19 2218 18     Temp 06/05/19 2218 98.6 F (37 C)     Temp Source 06/05/19 2218 Oral     SpO2 06/05/19 2218 100 %     Weight --      Height --      Head Circumference --      Peak Flow --      Pain Score 06/05/19 2219 6     Pain Loc --      Pain Edu? --      Excl. in GC? --     Constitutional: Alert and oriented.  Appears uncomfortable but is not in severe distress and is nontoxic in appearance. Eyes: Conjunctivae are normal.  Head: Atraumatic. Cardiovascular: Normal rate, regular rhythm. Good peripheral circulation. Respiratory: Normal respiratory effort.  No retractions. Musculoskeletal: No lower extremity tenderness nor edema. No gross deformities of extremities. Neurologic:  Normal speech and language. No gross focal neurologic deficits are appreciated.  Skin: The patient has a golf ball sized abscess that is erythematous and firm in the right axilla with several centimeters of surrounding cellulitis in the inferior direction below the abscess.  Other than the mild surrounding cellulitis, the area is well confined to the abscess itself.  No other abnormalities appreciated. Psychiatric: Mood and affect are normal. Speech and behavior are normal.  ____________________________________________   LABS (all labs ordered are listed, but only abnormal results are displayed)  Labs Reviewed  AEROBIC/ANAEROBIC CULTURE (SURGICAL/DEEP WOUND)   ____________________________________________  EKG  No indication for EKG ____________________________________________  RADIOLOGY I, Loleta Rose, personally viewed and evaluated these images (plain radiographs) as part of my medical decision making, as well as reviewing the written report by the radiologist.  ED MD interpretation: Bedside ultrasound performed by me reveals well-circumscribed abscess with no immediately adjacent vascular structures.   Minimal surrounding cobblestoning suggestive of only mild cellulitis.  Official radiology report(s): No results found.  ____________________________________________   PROCEDURES   Procedure(s) performed (including Critical Care):  Marland KitchenMarland KitchenIncision and Drainage  Date/Time: 06/06/2019 5:38 AM Performed by: Loleta Rose, MD Authorized by: Loleta Rose, MD   Consent:    Consent obtained:  Verbal   Consent given by:  Patient   Risks discussed:  Bleeding, infection, incomplete drainage and pain   Alternatives discussed:  Alternative treatment, delayed treatment and observation Location:    Type:  Abscess   Size:  4 cm in diameter   Location:  Upper extremity   Upper extremity location: right axillae. Pre-procedure details:    Skin preparation:  Betadine Sedation:    Sedation type: Percocet x 2. Anesthesia (see MAR for exact dosages):    Anesthesia method:  Local infiltration   Local anesthetic:  Lidocaine 1% w/o epi Procedure type:    Complexity:  Complex Procedure details:    Incision types:  Single straight   Scalpel blade:  11   Wound management:  Probed and deloculated and irrigated with saline   Drainage:  Bloody and purulent   Drainage amount:  Copious   Wound treatment:  Wound left open   Packing materials:  None Post-procedure details:    Patient tolerance of procedure:  Tolerated well, no immediate complications      ____________________________________________   INITIAL IMPRESSION / MDM / ASSESSMENT AND PLAN / ED COURSE  As part of my medical decision making, I reviewed the following data within the [place] notes reviewed and incorporated, Old chart reviewed, Notes from prior ED visits and [place] Controlled Substance Database   The patient has a large right axillary abscess.  There is some surrounding cellulitis but is relatively minimal and she has no systemic signs or symptoms of infection.  I think she will tolerate oral  antibiotics well if the lesion is adequately drained.  I performed a bedside ultrasound that revealed that the abscess is about a centimeter deep and even though the total area of induration is about 4 cm in diameter, the drainable purulent area was about 2 cm in diameter and about a centimeter deep.  There were no nearby vascular structures and I did not feel she would be an appropriate candidate for surgical admission and going to the operating room.  After we discussed the various options and risks and benefits she agreed with bedside I&D.  The incision and drainage was successful with copious drainage of bloody and purulent material.  Wound culture was sent.  The patient got a dose of Keflex 500 mg and 2 Bactrim DS tablets.  Given the possibility of persistent infection and the surrounding cellulitis I gave her a 10-day course of Bactrim DS and Keflex and I gave her follow-up information in the surgical clinic and I gave strict instructions that if she feels like she is getting worse she should return to the ED or go to the surgical clinic immediately.  She says she understands and agrees with the plan.          ____________________________________________  FINAL CLINICAL IMPRESSION(S) / ED DIAGNOSES  Final diagnoses:  Abscess of axilla, right     MEDICATIONS GIVEN DURING THIS VISIT:  Medications  ibuprofen (ADVIL) tablet 400 mg (400 mg Oral Given 06/05/19 2318)  lidocaine (PF) (XYLOCAINE) 1 % injection 10 mL (10 mLs Other Given by Other 06/06/19 0506)  oxyCODONE-acetaminophen (PERCOCET/ROXICET) 5-325 MG per tablet 2 tablet (2 tablets Oral Given 06/06/19 0331)  sulfamethoxazole-trimethoprim (BACTRIM DS) 800-160 MG per tablet 2 tablet (2 tablets Oral Given 06/06/19 0331)  cephALEXin (KEFLEX) capsule 500 mg (500 mg Oral Given 06/06/19 0331)     ED Discharge Orders         Ordered    sulfamethoxazole-trimethoprim (BACTRIM DS) 800-160 MG tablet  2 times daily     06/06/19 0537    cephALEXin  (KEFLEX) 500 MG capsule  4 times daily     06/06/19 0537    oxyCODONE-acetaminophen (PERCOCET) 5-325 MG tablet  Every 6 hours PRN     06/06/19 0537          *Please note:  Shaneta A Propes was evaluated in Emergency Department on 06/06/2019 for the symptoms described in the history of present illness. She was evaluated in the context of the global COVID-19 pandemic, which necessitated consideration that the patient might be at risk for infection with the SARS-CoV-2 virus that causes COVID-19. Institutional protocols and algorithms that pertain to the evaluation of patients at risk for COVID-19 are in a state of rapid change based on information  released by regulatory bodies including the CDC and federal and state organizations. These policies and algorithms were followed during the patient's care in the ED.  Some ED evaluations and interventions may be delayed as a result of limited staffing during the pandemic.*  Note:  This document was prepared using Dragon voice recognition software and may include unintentional dictation errors.   Loleta RoseForbach, Josip Merolla, MD 06/06/19 [phone]

## 2019-06-11 LAB — AEROBIC/ANAEROBIC CULTURE W GRAM STAIN (SURGICAL/DEEP WOUND): Gram Stain: NONE SEEN

## 2019-06-12 NOTE — Progress Notes (Signed)
Called pt again at 1810. Someone picked up but was not Hala, asked if Tonni can call me back. I called in prescription to [facility] for Clindamycin 300 mg four times a day for 5 days per Dr. Joan Mayans. Will wait for pt to call me back to inform her the prescription was called in.   Eleonore Chiquito, PharmD, BCPS

## 2019-06-12 NOTE — Consult Note (Signed)
Spoke with pt, and called in the prescription to [facility] on [address]. Clindamycin 300 mg q6H x 5 days. Pt was counseled on the phone. Pt will pick up abx 9/4 AM.

## 2019-06-12 NOTE — Consult Note (Signed)
Called pt's cell phone. No answer and left a call back number. Pt is growing MRSA in abscess culture. Pt was sent home on bactrim, which shows resistance on sensitivity report. Case discussed with Dr. Royden Purl. Agreed to change antibiotic to clindamycin 300 mg by mouth four times days for 5 days. Will call again tomorrow, if pt doesn't call back.   Eleonore Chiquito, PharmD, BCPS

## 2019-09-26 ENCOUNTER — Other Ambulatory Visit: Payer: Self-pay

## 2019-09-26 DIAGNOSIS — N939 Abnormal uterine and vaginal bleeding, unspecified: Secondary | ICD-10-CM | POA: Insufficient documentation

## 2019-09-26 DIAGNOSIS — Z5321 Procedure and treatment not carried out due to patient leaving prior to being seen by health care provider: Secondary | ICD-10-CM | POA: Insufficient documentation

## 2019-09-26 LAB — CBC WITH DIFFERENTIAL/PLATELET
Abs Immature Granulocytes: 0.02 K/uL (ref 0.00–0.07)
Basophils Absolute: 0 K/uL (ref 0.0–0.1)
Basophils Relative: 0 %
Eosinophils Absolute: 0.2 K/uL (ref 0.0–0.5)
Eosinophils Relative: 2 %
HCT: 39.9 % (ref 36.0–46.0)
Hemoglobin: 13.1 g/dL (ref 12.0–15.0)
Immature Granulocytes: 0 %
Lymphocytes Relative: 7 %
Lymphs Abs: 0.7 K/uL (ref 0.7–4.0)
MCH: 30.4 pg (ref 26.0–34.0)
MCHC: 32.8 g/dL (ref 30.0–36.0)
MCV: 92.6 fL (ref 80.0–100.0)
Monocytes Absolute: 0 K/uL — ABNORMAL LOW (ref 0.1–1.0)
Monocytes Relative: 0 %
Neutro Abs: 9 K/uL — ABNORMAL HIGH (ref 1.7–7.7)
Neutrophils Relative %: 91 %
Platelets: 279 K/uL (ref 150–400)
RBC: 4.31 MIL/uL (ref 3.87–5.11)
RDW: 12.7 % (ref 11.5–15.5)
WBC: 10.1 K/uL (ref 4.0–10.5)
nRBC: 0 % (ref 0.0–0.2)

## 2019-09-26 LAB — BASIC METABOLIC PANEL
Anion gap: 11 (ref 5–15)
BUN: 14 mg/dL (ref 6–20)
CO2: 24 mmol/L (ref 22–32)
Calcium: 9.2 mg/dL (ref 8.9–10.3)
Chloride: 105 mmol/L (ref 98–111)
Creatinine, Ser: 0.68 mg/dL (ref 0.44–1.00)
GFR calc Af Amer: >60 mL/min (ref >60)
GFR calc non Af Amer: >60 mL/min (ref >60)
Glucose, Bld: 86 mg/dL (ref 70–99)
Potassium: 3.9 mmol/L (ref 3.5–5.1)
Sodium: 140 mmol/L (ref 135–145)

## 2019-09-26 LAB — HCG, QUANTITATIVE, PREGNANCY: hCG, Beta Chain, Quant, S: <1 m[IU]/mL (ref <5)

## 2019-09-26 NOTE — ED Notes (Signed)
Patient to waiting room via wheelchair by EMS.  Per EMS patient with complaint of vaginal bleeding for 3 days (bright red) and abdominal pain, ?pregnancy.  Vital signs:  Temp 98.8 (oral),  HR 92, BP 140/90 pulse oxi 98% on room.

## 2019-09-26 NOTE — ED Triage Notes (Addendum)
Pt reports vaginal bleeding with clots and heavier consistency x3 days. Blood was bright red and mucousy yesterday, pt now reports blood spots only when wiping after urinating. Not soaking pads/tampons. Pt now complains of abd pain that feels "almost like a cramping".

## 2019-09-27 ENCOUNTER — Emergency Department
Admission: EM | Admit: 2019-09-27 | Discharge: 2019-09-27 | Disposition: A | Discharge status: Home / Self Care | Payer: Self-pay | Attending: Emergency Medicine | Admitting: Emergency Medicine

## 2019-09-27 ENCOUNTER — Emergency Department: Payer: Self-pay

## 2019-09-27 DIAGNOSIS — R52 Pain, unspecified: Secondary | ICD-10-CM

## 2020-02-12 ENCOUNTER — Other Ambulatory Visit: Payer: Self-pay

## 2020-02-12 ENCOUNTER — Emergency Department: Payer: Self-pay

## 2020-02-12 ENCOUNTER — Encounter: Payer: Self-pay | Admitting: Emergency Medicine

## 2020-02-12 DIAGNOSIS — F17210 Nicotine dependence, cigarettes, uncomplicated: Secondary | ICD-10-CM | POA: Insufficient documentation

## 2020-02-12 DIAGNOSIS — W458XXA Other foreign body or object entering through skin, initial encounter: Secondary | ICD-10-CM | POA: Insufficient documentation

## 2020-02-12 DIAGNOSIS — Y939 Activity, unspecified: Secondary | ICD-10-CM | POA: Insufficient documentation

## 2020-02-12 DIAGNOSIS — Z23 Encounter for immunization: Secondary | ICD-10-CM | POA: Insufficient documentation

## 2020-02-12 DIAGNOSIS — Z79899 Other long term (current) drug therapy: Secondary | ICD-10-CM | POA: Insufficient documentation

## 2020-02-12 DIAGNOSIS — Y929 Unspecified place or not applicable: Secondary | ICD-10-CM | POA: Insufficient documentation

## 2020-02-12 DIAGNOSIS — Y999 Unspecified external cause status: Secondary | ICD-10-CM | POA: Insufficient documentation

## 2020-02-12 DIAGNOSIS — S80851A Superficial foreign body, right lower leg, initial encounter: Secondary | ICD-10-CM | POA: Insufficient documentation

## 2020-02-12 NOTE — ED Triage Notes (Signed)
Pt to traige via w/c with no distress noted, mask in place; pt reports caught a fish hook in rt lower leg since 5pm

## 2020-02-13 ENCOUNTER — Emergency Department
Admission: EM | Admit: 2020-02-13 | Discharge: 2020-02-13 | Disposition: A | Discharge status: Home / Self Care | Payer: Self-pay | Attending: Emergency Medicine | Admitting: Emergency Medicine

## 2020-02-13 ENCOUNTER — Emergency Department: Payer: Self-pay

## 2020-02-13 ENCOUNTER — Other Ambulatory Visit: Payer: Self-pay

## 2020-02-13 DIAGNOSIS — F17210 Nicotine dependence, cigarettes, uncomplicated: Secondary | ICD-10-CM | POA: Insufficient documentation

## 2020-02-13 DIAGNOSIS — Y999 Unspecified external cause status: Secondary | ICD-10-CM | POA: Insufficient documentation

## 2020-02-13 DIAGNOSIS — R519 Headache, unspecified: Secondary | ICD-10-CM | POA: Insufficient documentation

## 2020-02-13 DIAGNOSIS — Y929 Unspecified place or not applicable: Secondary | ICD-10-CM | POA: Insufficient documentation

## 2020-02-13 DIAGNOSIS — S0083XA Contusion of other part of head, initial encounter: Secondary | ICD-10-CM

## 2020-02-13 DIAGNOSIS — Z79899 Other long term (current) drug therapy: Secondary | ICD-10-CM | POA: Insufficient documentation

## 2020-02-13 DIAGNOSIS — S8991XA Unspecified injury of right lower leg, initial encounter: Secondary | ICD-10-CM

## 2020-02-13 DIAGNOSIS — Y939 Activity, unspecified: Secondary | ICD-10-CM | POA: Insufficient documentation

## 2020-02-13 MED ORDER — LEVOFLOXACIN 750 MG PO TABS
750.0000 mg | ORAL_TABLET | Freq: Once | ORAL | Status: AC
  Administered 2020-02-13: 750 mg via ORAL
  Filled 2020-02-13: qty 1

## 2020-02-13 MED ORDER — CLINDAMYCIN HCL 150 MG PO CAPS: 450.0000 mg | ORAL_CAPSULE | Freq: Three times a day (TID) | ORAL | 0 refills | Status: AC

## 2020-02-13 MED ORDER — MELOXICAM 15 MG PO TABS: 15.0000 mg | ORAL_TABLET | Freq: Every day | ORAL | 0 refills | Status: DC

## 2020-02-13 MED ORDER — CLINDAMYCIN HCL 150 MG PO CAPS
450.0000 mg | ORAL_CAPSULE | Freq: Once | ORAL | Status: AC
  Administered 2020-02-13: 450 mg via ORAL
  Filled 2020-02-13: qty 3

## 2020-02-13 MED ORDER — OXYCODONE-ACETAMINOPHEN 5-325 MG PO TABS
1.0000 | ORAL_TABLET | Freq: Once | ORAL | Status: AC
  Administered 2020-02-13: 1 via ORAL
  Filled 2020-02-13: qty 1

## 2020-02-13 MED ORDER — LEVOFLOXACIN 750 MG PO TABS: 750.0000 mg | ORAL_TABLET | Freq: Every day | ORAL | 0 refills | Status: AC

## 2020-02-13 MED ORDER — ONDANSETRON 8 MG PO TBDP
8.0000 mg | ORAL_TABLET | Freq: Once | ORAL | Status: AC
  Administered 2020-02-13: 8 mg via ORAL
  Filled 2020-02-13: qty 1

## 2020-02-13 MED ORDER — TETANUS-DIPHTH-ACELL PERTUSSIS 5-2.5-18.5 LF-MCG/0.5 IM SUSP
0.5000 mL | Freq: Once | INTRAMUSCULAR | Status: AC
  Administered 2020-02-13: 0.5 mL via INTRAMUSCULAR
  Filled 2020-02-13: qty 0.5

## 2020-02-13 MED ORDER — CLINDAMYCIN HCL 150 MG PO CAPS: 450.0000 mg | ORAL_CAPSULE | Freq: Three times a day (TID) | ORAL | 0 refills | Status: DC

## 2020-02-13 MED ORDER — LEVOFLOXACIN 750 MG PO TABS: 750.0000 mg | ORAL_TABLET | Freq: Every day | ORAL | 0 refills | Status: DC

## 2020-02-13 NOTE — Discharge Instructions (Signed)
As we discussed, please keep your wound dry and clean at least for the next 24 hours.  After that you can shower but try not to submerge your leg in water.  We left the wound partially open to allow it to heal from the inside out given the risk of infection.  Please take the full 5 days of both antibiotics prescribed.  You can expect the wound to be sore, somewhat swollen, and bruised, but if you are concerned it is becoming infected in spite of the antibiotics, please return to the emergency department for additional evaluation.

## 2020-02-13 NOTE — ED Provider Notes (Signed)
[facility] Emergency Department Provider Note  ____________________________________________   First MD Initiated Contact with Patient 02/13/20 0448     (approximate)  I have reviewed the triage vital signs and the nursing notes.   HISTORY  Chief Complaint Foreign Body    HPI Bailey Davis is a 24 y.o. female with a history of methamphetamine abuse who presents for evaluation of an embedded fishhook in her posterior right lower leg.  She is not certain how this happened.  She said that she was walking [place] property but through a side walkway and she thought she sustained a snake bite.  When she laid down she saw a large hook coming from her leg.  Through the process of trying to remove it she accidentally caused it to go deeper into her leg.  It is unclear why it took a significant amount of time for her to come to the emergency department for treatment but then due to overwhelming ED volumes she waited for substantial number of hours in the ED before being brought back.  She reports severe localized pain with any amount of movement of the hip but is able to bear weight.  No numbness nor tingling.  No bleeding from the wound.  She denies fever, nausea, vomiting, abdominal pain, chest pain, shortness of breath.  She is uncertain of the date of her last tetanus vaccination.         History reviewed. No pertinent past medical history.  Patient Active Problem List   Diagnosis Date Noted  . 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 No pertinent surgical history.  Prior to Admission medications   Medication Sig Start Date End Date Taking? Authorizing Provider  acetaminophen (TYLENOL) 325 MG tablet Take 325-650 mg by mouth every 6 (six) hours as needed for moderate pain.     [provider]  amoxicillin (AMOXIL) 500 MG capsule Take 1 capsule (500 mg total) by mouth 3 (three) times daily. Patient not taking: Reported on 02/28/2019  06/29/17   Joni Reining, PA-C  cephALEXin (KEFLEX) 500 MG capsule Take 1 capsule (500 mg total) by mouth 4 (four) times daily. 06/06/19   Loleta Rose, MD  clindamycin (CLEOCIN) 150 MG capsule Take 3 capsules (450 mg total) by mouth 3 (three) times daily for 5 days. 02/13/20 02/18/20  Loleta Rose, MD  fexofenadine-pseudoephedrine (ALLEGRA-D) 60-120 MG 12 hr tablet Take 1 tablet by mouth 2 (two) times daily. Patient not taking: Reported on 02/28/2019 06/29/17   Joni Reining, PA-C  levofloxacin (LEVAQUIN) 750 MG tablet Take 1 tablet (750 mg total) by mouth daily for 5 days. 02/13/20 02/18/20  Loleta Rose, MD  omeprazole (PRILOSEC) 20 MG capsule Take 20 mg by mouth daily as needed (for GERD).    [provider]  ondansetron (ZOFRAN) 4 MG tablet Take 1-2 tabs by mouth every 8 hours as needed for nausea/vomiting Patient not taking: Reported on 02/28/2019 08/29/17   Loleta Rose, MD  oxyCODONE-acetaminophen (PERCOCET) 5-325 MG tablet Take 2 tablets by mouth every 6 (six) hours as needed for severe pain. 06/06/19   Loleta Rose, MD  potassium chloride SA (KLOR-CON M20) 20 MEQ tablet Take 1 tablet (20 mEq total) by mouth daily. Patient not taking: Reported on 02/28/2019 08/29/17   Loleta Rose, MD  sulfamethoxazole-trimethoprim (BACTRIM DS) 800-160 MG tablet Take 2 tablets by mouth 2 (two) times daily. 06/06/19   Loleta Rose, MD    Allergies Other and Vicodin [hydrocodone-acetaminophen]  No family history on file.  Social History Social History   Tobacco Use  . Smoking status: Current Every Day Smoker    Packs/day: 0.50    Types: Cigarettes  . Smokeless tobacco: Never Used  Substance Use Topics  . Alcohol use: Yes  . Drug use: Yes    Types: Marijuana, Methamphetamines    Review of Systems Constitutional: No fever/chills Cardiovascular: Denies chest pain. Respiratory: Denies shortness of breath. Gastrointestinal: No abdominal pain.  No nausea, no vomiting.   Musculoskeletal:  Negative for neck pain.  Negative for back pain. Integumentary: Fishhook embedded in the posterior right lower leg. Neurological: Negative for headaches, focal weakness or numbness.   ____________________________________________   PHYSICAL EXAM:  VITAL SIGNS: ED Triage Vitals  Enc Vitals Group     BP 02/12/20 2332 (!) 155/106     Pulse Rate 02/12/20 2332 (!) 108     Resp 02/12/20 2332 18     Temp 02/12/20 2332 98 F (36.7 C)     Temp Source 02/12/20 2332 Oral     SpO2 02/12/20 2332 98 %     Weight 02/12/20 2333 49.9 kg (110 lb)     Height 02/12/20 2333 1.575 m (5\' 2" )     Head Circumference --      Peak Flow --      Pain Score 02/12/20 2332 5     Pain Loc --      Pain Edu? --      Excl. in GC? --     Constitutional: Alert and oriented.  Eyes: Conjunctivae are normal.  Head: Atraumatic. Cardiovascular: Normal rate, regular rhythm. Good peripheral circulation.  Respiratory: Normal respiratory effort.  No retractions. Gastrointestinal: Soft and nontender. No distention.  Musculoskeletal: Fishhook embedded in the posterior right lower leg.  See skin exam below. Neurologic:  Normal speech and language. No gross focal neurologic deficits are appreciated.  Skin:  Skin is warm, dry and intact except for embedded fishhook.  She has one barb from a large treble hook that is buried deeply within the leg.  There is surrounding bruising suggesting the subacute nature of the wound but there is no evidence of surrounding cellulitis.  No other injuries are noted. Psychiatric: Mood and affect are normal. Speech and behavior are normal.  ____________________________________________   LABS (all labs ordered are listed, but only abnormal results are displayed)  Labs Reviewed - No data to display ____________________________________________  EKG  No indication for EKG ____________________________________________  RADIOLOGY I, 04/13/20, personally viewed and evaluated these  images (plain radiographs) as part of my medical decision making, as well as reviewing the written report by the radiologist.  ED MD interpretation: Fishhook embedded in the soft tissues of the medial calf, no fracture or dislocation.  Official radiology report(s): DG Tibia/Fibula Right  Result Date: 02/13/2020 CLINICAL DATA:  24 year old female with a fishhook in the right lower extremity. EXAM: RIGHT TIBIA AND FIBULA - 2 VIEW COMPARISON:  None. FINDINGS: There is no acute fracture or dislocation. The bones are well mineralized. No arthritic changes. A fishhook is seen penetrating in the subcutaneous soft tissues of the proximal medial calf. IMPRESSION: 1. No acute fracture or dislocation. 2. Facial in the superficial soft tissues of the medial calf. Electronically Signed   By: 24 M.D.   On: 02/13/2020 00:01    ____________________________________________   PROCEDURES   Procedure(s) performed (including Critical Care):  .Foreign Body Removal  Date/Time: 02/13/2020 6:15 AM Performed by: 04/14/2020, MD  Authorized by: Loleta Rose, MD  Consent: Verbal consent obtained. Consent given by: patient Patient identity confirmed: verbally with patient Body area: skin General location: lower extremity Location details: right lower leg Anesthesia: local infiltration  Anesthesia: Local Anesthetic: lidocaine 1% with epinephrine Anesthetic total: 12 mL  Sedation: Patient sedated: no  Patient cooperative: yes Removal mechanism: scalpel and forceps Dressing: dressing applied Depth: deep Complexity: complex 1 objects recovered. Objects recovered: large barbed fishhook Post-procedure assessment: foreign body removed Patient tolerance: patient tolerated the procedure well with no immediate complications     ____________________________________________   INITIAL IMPRESSION / MDM / ASSESSMENT AND PLAN / ED COURSE  As part of my medical decision making, I reviewed the  following data within the electronic MEDICAL RECORD NUMBER Nursing notes reviewed and incorporated, Old chart reviewed, Radiograph reviewed , Notes from prior ED visits and [place] Controlled Substance Database   Differential diagnosis includes, but is not limited to, foreign body (fishhook), contamination, tetanus, staff or strep infection, Aeromonas, Edwardsiella tarda, Vibrio vulnificus, Mycobacterium marimun, other nonspecific anaerobes.  Vascular and nerve injury are also possible.  Reassuring x-ray with no evidence of bony involvement or gas in the tissue.  The fishhook removal was complex because of the size of the hook.  It could not be clipped by wire cutters available to [place] in the emergency department so I cannot push through the hook and clipped off the bar to back out the remainder of.  After thoroughly infiltrating with lidocaine 1% with epinephrine, I used an 11 blade scalpel to incise the tissue around the hook to allow for easy removal.  This involved extending the wound substantially given the size of the hook.  After the hook was removed I irrigated the wound thoroughly given the high possibility of contamination.  The remainder of the treble hook (the non-embedded two hooks) were rusty and dirty and appeared well-used.  She also has had the hook embedded since approximately 5 PM, which was more than 12 hours prior to when I was able to remove it.  As result I am treating aggressively in [place] with prophylactic antibiotics.  After looking up recommendations, I started her on clindamycin 450 mg p.o. 3 times daily x5 days as well as levofloxacin 750 mg daily for 5 days.  This should adequately cover skin flora as well as anaerobes and aquatic pathogens.  Given the nature of the wound I did not want to suture it tightly so after explained to the patient my plan, I loosely approximated it with a Steri-Strip and explained to her that it is better if it heals from the inside out.  She says that she  understands and agrees with the plan and I gave my usual and customary wound management recommendations and return precautions.  She received a first dose of both antibiotics in the emergency department.  I also provided GoodRx coupons for both of antibiotics.  She received a tetanus vaccination as well.               ____________________________________________  FINAL CLINICAL IMPRESSION(S) / ED DIAGNOSES  Final diagnoses:  Fish hook injury of right lower leg, initial encounter     MEDICATIONS GIVEN DURING THIS VISIT:  Medications  Tdap (BOOSTRIX) injection 0.5 mL (0.5 mLs Intramuscular Given 02/13/20 0515)  levofloxacin (LEVAQUIN) tablet 750 mg (750 mg Oral Given 02/13/20 0739)  clindamycin (CLEOCIN) capsule 450 mg (450 mg Oral Given 02/13/20 0739)     ED Discharge Orders  Ordered    clindamycin (CLEOCIN) 150 MG capsule  3 times daily     02/13/20 0723    levofloxacin (LEVAQUIN) 750 MG tablet  Daily     02/13/20 0723          *Please note:  Bailey Davis was evaluated in Emergency Department on 02/13/2020 for the symptoms described in the history of present illness. She was evaluated in the context of the global COVID-19 pandemic, which necessitated consideration that the patient might be at risk for infection with the SARS-CoV-2 virus that causes COVID-19. Institutional protocols and algorithms that pertain to the evaluation of patients at risk for COVID-19 are in a state of rapid change based on information released by regulatory bodies including the CDC and federal and state organizations. These policies and algorithms were followed during the patient's care in the ED.  Some ED evaluations and interventions may be delayed as a result of limited staffing during the pandemic.*  Note:  This document was prepared using Dragon voice recognition software and may include unintentional dictation errors.   Hinda Kehr, MD 02/13/20 [phone]
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
## 2020-02-13 NOTE — ED Notes (Signed)
Pt returns to room escorted by screener.
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
## 2020-02-13 NOTE — ED Notes (Signed)
Pt's mother in room when asked if pt has returned mother states she has not seen nor heard from her.
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
## 2020-02-13 NOTE — ED Notes (Signed)
Pt stated she wanted to go smoke and left her room to go outside.

## 2020-02-29 ENCOUNTER — Emergency Department: Payer: Self-pay

## 2020-02-29 ENCOUNTER — Other Ambulatory Visit: Payer: Self-pay

## 2020-02-29 ENCOUNTER — Encounter: Payer: Self-pay | Admitting: Emergency Medicine

## 2020-02-29 ENCOUNTER — Emergency Department
Admission: EM | Admit: 2020-02-29 | Discharge: 2020-02-29 | Disposition: A | Discharge status: Home / Self Care | Payer: Self-pay | Attending: Emergency Medicine | Admitting: Emergency Medicine

## 2020-02-29 DIAGNOSIS — S50811A Abrasion of right forearm, initial encounter: Secondary | ICD-10-CM | POA: Insufficient documentation

## 2020-02-29 DIAGNOSIS — F1510 Other stimulant abuse, uncomplicated: Secondary | ICD-10-CM | POA: Insufficient documentation

## 2020-02-29 DIAGNOSIS — F1910 Other psychoactive substance abuse, uncomplicated: Secondary | ICD-10-CM

## 2020-02-29 DIAGNOSIS — F1410 Cocaine abuse, uncomplicated: Secondary | ICD-10-CM | POA: Insufficient documentation

## 2020-02-29 DIAGNOSIS — Y93I9 Activity, other involving external motion: Secondary | ICD-10-CM | POA: Insufficient documentation

## 2020-02-29 DIAGNOSIS — F17210 Nicotine dependence, cigarettes, uncomplicated: Secondary | ICD-10-CM | POA: Insufficient documentation

## 2020-02-29 DIAGNOSIS — Y92414 Local residential or business street as the place of occurrence of the external cause: Secondary | ICD-10-CM | POA: Insufficient documentation

## 2020-02-29 DIAGNOSIS — Y999 Unspecified external cause status: Secondary | ICD-10-CM | POA: Insufficient documentation

## 2020-02-29 LAB — URINE DRUG SCREEN, QUALITATIVE (ARMC ONLY)
Amphetamines, Ur Screen: POSITIVE — AB
Barbiturates, Ur Screen: NOT DETECTED
Benzodiazepine, Ur Scrn: NOT DETECTED
Cannabinoid 50 Ng, Ur ~~LOC~~: NOT DETECTED
Cocaine Metabolite,Ur ~~LOC~~: POSITIVE — AB
MDMA (Ecstasy)Ur Screen: NOT DETECTED
Methadone Scn, Ur: NOT DETECTED
Opiate, Ur Screen: NOT DETECTED
Phencyclidine (PCP) Ur S: NOT DETECTED
Tricyclic, Ur Screen: NOT DETECTED

## 2020-02-29 LAB — URINALYSIS, COMPLETE (UACMP) WITH MICROSCOPIC
Bacteria, UA: NONE SEEN
Bilirubin Urine: NEGATIVE
Glucose, UA: NEGATIVE mg/dL
Hgb urine dipstick: NEGATIVE
Ketones, ur: NEGATIVE mg/dL
Leukocytes,Ua: NEGATIVE
Nitrite: NEGATIVE
Protein, ur: NEGATIVE mg/dL
Specific Gravity, Urine: 1.034 — ABNORMAL HIGH (ref 1.005–1.030)
pH: 6 (ref 5.0–8.0)

## 2020-02-29 LAB — BASIC METABOLIC PANEL
Anion gap: 8 (ref 5–15)
BUN: 14 mg/dL (ref 6–20)
CO2: 27 mmol/L (ref 22–32)
Calcium: 9 mg/dL (ref 8.9–10.3)
Chloride: 108 mmol/L (ref 98–111)
Creatinine, Ser: 0.9 mg/dL (ref 0.44–1.00)
GFR calc Af Amer: >60 mL/min (ref >60)
GFR calc non Af Amer: >60 mL/min (ref >60)
Glucose, Bld: 105 mg/dL — ABNORMAL HIGH (ref 70–99)
Potassium: 3.1 mmol/L — ABNORMAL LOW (ref 3.5–5.1)
Sodium: 143 mmol/L (ref 135–145)

## 2020-02-29 LAB — ETHANOL: Alcohol, Ethyl (B): <10 mg/dL (ref <10)

## 2020-02-29 LAB — SALICYLATE LEVEL: Salicylate Lvl: <7 mg/dL — ABNORMAL LOW (ref 7.0–30.0)

## 2020-02-29 LAB — CBC
HCT: 38.4 % (ref 36.0–46.0)
Hemoglobin: 12.9 g/dL (ref 12.0–15.0)
MCH: 30.6 pg (ref 26.0–34.0)
MCHC: 33.6 g/dL (ref 30.0–36.0)
MCV: 91 fL (ref 80.0–100.0)
Platelets: 269 10*3/uL (ref 150–400)
RBC: 4.22 MIL/uL (ref 3.87–5.11)
RDW: 13.7 % (ref 11.5–15.5)
WBC: 5.3 10*3/uL (ref 4.0–10.5)
nRBC: 0 % (ref 0.0–0.2)

## 2020-02-29 LAB — ACETAMINOPHEN LEVEL: Acetaminophen (Tylenol), Serum: <10 ug/mL — ABNORMAL LOW (ref 10–30)

## 2020-02-29 LAB — HCG, QUANTITATIVE, PREGNANCY: hCG, Beta Chain, Quant, S: <1 m[IU]/mL (ref <5)

## 2020-02-29 MED ORDER — IOHEXOL 300 MG/ML  SOLN
75.0000 mL | Freq: Once | INTRAMUSCULAR | Status: AC | PRN
  Administered 2020-02-29: 75 mL via INTRAVENOUS
  Filled 2020-02-29: qty 75

## 2020-02-29 MED ORDER — TETANUS-DIPHTH-ACELL PERTUSSIS 5-2.5-18.5 LF-MCG/0.5 IM SUSP
0.5000 mL | Freq: Once | INTRAMUSCULAR | Status: DC
  Filled 2020-02-29: qty 0.5

## 2020-02-29 MED ORDER — SODIUM CHLORIDE 0.9 % IV BOLUS
1000.0000 mL | Freq: Once | INTRAVENOUS | Status: AC
  Administered 2020-02-29: 1000 mL via INTRAVENOUS

## 2020-02-29 NOTE — ED Notes (Signed)
Pt gave verbal consent to call brother
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
## 2020-02-29 NOTE — ED Notes (Signed)
Pt responsive to painful stimuli, localized pain with sternal rub.
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
## 2020-02-29 NOTE — ED Notes (Signed)
Pt sitting in lobby eating
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
## 2020-02-29 NOTE — ED Notes (Signed)
Pt easily arousable when asked who would pick her up. Pt states her mother and advised pt to call her mother to pick her up.

## 2020-02-29 NOTE — ED Notes (Signed)
Pt now A&O x4. Reports using meth yesterday. Denies pain. Drinking apple juice per request. Scotty Court MD made aware.

## 2020-02-29 NOTE — ED Notes (Signed)
Pt sleeping in room, difficult to arouse. Pt moving and making noise when shaken, not answering questions.

## 2020-04-05 ENCOUNTER — Encounter: Payer: Self-pay | Admitting: Family Medicine

## 2020-04-05 ENCOUNTER — Other Ambulatory Visit: Payer: Self-pay

## 2020-04-05 ENCOUNTER — Ambulatory Visit (LOCAL_COMMUNITY_HEALTH_CENTER): Payer: Self-pay | Admitting: Family Medicine

## 2020-04-05 ENCOUNTER — Ambulatory Visit: Payer: Self-pay

## 2020-04-05 VITALS — BP 117/67 | Ht 62.0 in | Wt 118.6 lb

## 2020-04-05 DIAGNOSIS — A599 Trichomoniasis, unspecified: Secondary | ICD-10-CM

## 2020-04-05 DIAGNOSIS — Z3009 Encounter for other general counseling and advice on contraception: Secondary | ICD-10-CM

## 2020-04-05 MED ORDER — METRONIDAZOLE 500 MG PO TABS: 2000.0000 mg | ORAL_TABLET | Freq: Once | ORAL | 0 refills | Status: AC

## 2020-04-05 NOTE — Progress Notes (Signed)
Family Planning Visit- Initial Visit  Subjective:  Bailey Davis is a 24 y.o.  No obstetric history on file.  being seen today for an initial well woman visit and to discuss family planning options. Patient reports they do not want a pregnancy in the next year.   Chief Complaint  Patient presents with  . Annual Exam    Pt has Methamphetamine abuse (HCC) on their problem list.   HPI  Patient reports she is here for PE, pap and would like a pregnancy test. She has been gaining weight since quitting drugs 2 months ago, breasts feel swollen. She has Nexplanon in place, due out 05/2020, would like this removed/reinserted soon.    No LMP recorded. Patient has had an implant. BCM: nexplanon, placed Aug 2018 Pt desires EC? n/a  Last pap per pt/review of record: never Last HIV test per pt/review of record: 3 yrs ago  Last breast exam: 3 yrs ago Personal/family hx breast cancer? no  Patient reports 1 partner(s) in last year. Do they desire STI screening (if no, why not)? yes  Does the patient desire a pregnancy in the next year? no   24 y.o., Body mass index is 21.69 kg/m. - Is patient eligible for HA1C diabetes screening based on BMI and age >80?  no  Has patient been screened once for HCV in the past?  no  No results found for: HCVAB  Does the patient have current of drug use, have a partner with drug use, and/or has been incarcerated since last result? no If yes-- Screen for HCV through [facility]   Does the patient meet criteria for HBV testing? yes Criteria:  -Household, sexual or needle sharing contact with HBV -History of drug use -HIV positive -Those with known Hep C  PHQ-2 score: 0  See flowsheet for other program required questions.   Health Maintenance Due  Topic Date Due  . Hepatitis C Screening  Never done  . COVID-19 Vaccine (1) Never done  . HIV Screening  Never done  . PAP-Cervical Cytology Screening  Never done  . PAP SMEAR-Modifier  Never done     ROS 10 point review of systems is otherwise negative except as mentioned in HPI and listed below: Easy bruising: x yrs  The following portions of the patient's history were reviewed and updated as appropriate: allergies, current medications, past family history, past medical history, past social history, past surgical history and problem list. Problem list updated.   See flowsheet for other program required questions.  Objective:   Vitals:   04/05/20 1744  BP: 117/67  Weight: 118 lb 9.6 oz (53.8 kg)  Height: 5\' 2"  (1.575 m)    Physical Exam  Vitals and nursing note reviewed.  Constitutional:      Appearance: Normal appearance.  HENT:     Head: Normocephalic and atraumatic.     Mouth/Throat:     Mouth: Mucous membranes are moist.     Pharynx: Oropharynx is clear. No oropharyngeal exudate or posterior oropharyngeal erythema.  Eyes:     Conjunctiva/sclera: Conjunctivae normal.  Neck:     Thyroid: No thyroid mass, thyromegaly or thyroid tenderness.  Cardiovascular:     Rate and Rhythm: Normal rate and regular rhythm.     Pulses: Normal pulses.     Heart sounds: Normal heart sounds.  Pulmonary:     Effort: Pulmonary effort is normal.     Breath sounds: Normal breath sounds.  Chest:  Breasts:        Right: Normal. No swelling, mass, nipple discharge, skin change or tenderness.        Left: Normal. No swelling, mass, nipple discharge, skin change or tenderness.  Abdominal:     General: Abdomen is flat.     Palpations: There is no mass.     Tenderness: There is no abdominal tenderness. There is no rebound.  Genitourinary:     General: Normal vulva.     Exam position: Lithotomy position.     Pubic Area: No rash or pubic lice.      Labia:        Right: No rash or lesion.        Left: No rash or lesion.      Vagina: Normal. No erythema, bleeding or lesions. +vaginal discharge, ph>4.5    Cervix: No cervical motion tenderness, discharge, friability, lesion or  erythema.     Uterus: Normal.      Adnexa: Right adnexa normal and left adnexa normal.     Rectum: Normal.  Lymphadenopathy:     Head:     Right side of head: No preauricular or posterior auricular adenopathy.     Left side of head: No preauricular or posterior auricular adenopathy.     Cervical: No cervical adenopathy.     Upper Body:     Right upper body: No supraclavicular or axillary adenopathy.     Left upper body: No supraclavicular or axillary adenopathy.     Lower Body: No right inguinal adenopathy. No left inguinal adenopathy.  Skin:    General: Skin is warm and dry.     Findings: No rash.  Neurological:     Mental Status: She is alert and oriented to person, place, and time.      Assessment and Plan:  Bailey Davis is a 24 y.o. female presenting to the [facility] Department for an initial well woman exam/family planning visit.  Contraception counseling: Reviewed all forms of birth control options in the tiered based approach. available including abstinence; over the counter/barrier methods; hormonal contraceptive medication including pill, patch, ring, injection,contraceptive implant, ECP; hormonal and nonhormonal IUDs; permanent sterilization options including vasectomy and the various tubal sterilization modalities. Risks, benefits, and typical effectiveness rates were reviewed.  Questions were answered.  Written information was also given to the patient to review.  Patient desires depo, this was prescribed for patient. She will follow up in  3 months for surveillance.  She was told to call with any further questions, or with any concerns about this method of contraception.  Emphasized use of condoms 100% of the time for STI prevention.  Emergency Contraception: n/a   1. Family planning services -BCM: Nexplanon, due to be replaced 05/2020 -Pap: done today -CBE: done today. "Active FYIs" info up to date. Recommended screening mammograms beginning at age  38 -STI screen: done today -Recommended PCP f/u for easy bruising -Congratulated on drug abstinence x2 mo, encouraged continued cessation.  - Pregnancy, urine - HIV [place] [facility] - Syphilis Serology, [place] [facility]     Return in about 2 months (around 06/05/2020) for Nexplanon removal/reinsertion.  No future appointments.  Kandee Keen, PA-C

## 2020-04-05 NOTE — Progress Notes (Signed)
Wet mount reviewed by Maximiano Coss PA-C and treated for trich only per Ms. Staples. Kathreen Cosier, LCSW card provided to client. Jossie Ng, RN

## 2020-04-06 LAB — PREGNANCY, URINE: Preg Test, Ur: NEGATIVE

## 2020-04-06 LAB — WET PREP FOR TRICH, YEAST, CLUE
Trichomonas Exam: POSITIVE — AB
Yeast Exam: NEGATIVE

## 2020-04-07 LAB — PAP IG (IMAGE GUIDED): PAP Smear Comment: 0

## 2020-04-08 ENCOUNTER — Telehealth: Payer: Self-pay

## 2020-04-08 NOTE — Telephone Encounter (Signed)
Telephone call to patient today and voicemail was full.  PAP Normal, Trichomonas present, but treated day of service, and HPV not done.  Repeat PAP in 3 years (due 03/2023) per Lyndel Safe, MD.  Hart Carwin, RN

## 2020-04-09 NOTE — Telephone Encounter (Signed)
Telephone call to patient today regarding PAP results.  Voicemail full.

## 2020-04-14 ENCOUNTER — Other Ambulatory Visit: Payer: Self-pay

## 2020-04-14 ENCOUNTER — Ambulatory Visit (LOCAL_COMMUNITY_HEALTH_CENTER): Payer: Self-pay | Admitting: Physician Assistant

## 2020-04-14 VITALS — BP 116/73 | Ht 62.0 in | Wt 119.0 lb

## 2020-04-14 DIAGNOSIS — Z3009 Encounter for other general counseling and advice on contraception: Secondary | ICD-10-CM

## 2020-04-14 DIAGNOSIS — Z3046 Encounter for surveillance of implantable subdermal contraceptive: Secondary | ICD-10-CM

## 2020-04-14 LAB — PREGNANCY, URINE: Preg Test, Ur: NEGATIVE

## 2020-04-14 NOTE — Progress Notes (Signed)
Patient here for Nexplanon removal. Patient states she is currently bleeding for past 2 days. Patient states she does not want any other form of BCM at this time. Patient thinks she may be pregnant.Burt Knack, RN

## 2020-04-14 NOTE — Progress Notes (Signed)
Nexplanon Removal Patient identified, informed consent performed, consent signed.   Appropriate time out taken. Nexplanon site identified.  Area prepped in usual sterile fashon. 2 ml of 1% lidocaine with Epinephrine was used to anesthetize the area at the distal end of the implant and along implant site. A small stab incision was made right beside the implant on the distal portion.  The Nexplanon rod was grasped  manually and removed without difficulty.  There was minimal blood loss. There were no complications.  Steri-strips were applied over the small incision.  A pressure bandage was applied to reduce any bruising.  The patient tolerated the procedure well and was given post procedure instructions.    Nexplanon:   Counseled patient to take OTC analgesic starting as soon as lidocaine starts to wear off and take regularly for at least 48 hr to decrease discomfort.  Specifically to take with food or milk to decrease stomach upset and for IB 600 mg (3 tablets) every 6 hrs; IB 800 mg (4 tablets) every 8 hrs; or Aleve 2 tablets every 12 hrs.  Counseled patient to use condoms with all sex for STD and pregnancy prevention.  Counseled patient re:  Pap results and the plan is to repeat pap in 3 years.  Counseled that other test results are not back yet and she will be called if she needs to RTC for any treatment.

## 2020-04-15 NOTE — Telephone Encounter (Signed)
Bailey Haber, PA reviewed with patient at office visit 04/14/2020 her PAP results.  Telephone Note is now complete. Hart Carwin, RN

## 2021-02-17 ENCOUNTER — Ambulatory Visit (LOCAL_COMMUNITY_HEALTH_CENTER): Payer: Self-pay | Admitting: Nurse Practitioner

## 2021-02-17 ENCOUNTER — Other Ambulatory Visit: Payer: Self-pay

## 2021-02-17 VITALS — BP 124/84 | Ht 61.0 in | Wt 118.0 lb

## 2021-02-17 DIAGNOSIS — Z3202 Encounter for pregnancy test, result negative: Secondary | ICD-10-CM

## 2021-02-17 NOTE — Progress Notes (Signed)
25 year old female seen today for pregnancy test.   Patient is currently not pregnant, states has had irregular periods since 04/2020.  Last normal period was 3/22.  Patient states that she had a very irregular period in 01/26/21, where she bled for one day.  Glenna Fellows, RN

## 2021-02-18 LAB — PREGNANCY, URINE: Preg Test, Ur: NEGATIVE

## 2021-03-18 ENCOUNTER — Ambulatory Visit: Payer: Managed Care, Other (non HMO)

## 2021-06-08 ENCOUNTER — Ambulatory Visit (LOCAL_COMMUNITY_HEALTH_CENTER): Payer: Managed Care, Other (non HMO) | Admitting: Advanced Practice Midwife

## 2021-06-08 ENCOUNTER — Other Ambulatory Visit: Payer: Self-pay

## 2021-06-08 ENCOUNTER — Encounter: Payer: Self-pay | Admitting: Advanced Practice Midwife

## 2021-06-08 VITALS — BP 124/73 | Ht 62.0 in | Wt 116.2 lb

## 2021-06-08 DIAGNOSIS — F1290 Cannabis use, unspecified, uncomplicated: Secondary | ICD-10-CM | POA: Insufficient documentation

## 2021-06-08 DIAGNOSIS — F17200 Nicotine dependence, unspecified, uncomplicated: Secondary | ICD-10-CM

## 2021-06-08 DIAGNOSIS — Z3009 Encounter for other general counseling and advice on contraception: Secondary | ICD-10-CM

## 2021-06-08 DIAGNOSIS — Z789 Other specified health status: Secondary | ICD-10-CM | POA: Insufficient documentation

## 2021-06-08 DIAGNOSIS — F1910 Other psychoactive substance abuse, uncomplicated: Secondary | ICD-10-CM

## 2021-06-08 DIAGNOSIS — F1510 Other stimulant abuse, uncomplicated: Secondary | ICD-10-CM

## 2021-06-08 DIAGNOSIS — A599 Trichomoniasis, unspecified: Secondary | ICD-10-CM | POA: Insufficient documentation

## 2021-06-08 DIAGNOSIS — Z720 Tobacco use: Secondary | ICD-10-CM

## 2021-06-08 HISTORY — DX: Trichomoniasis, unspecified: A59.9

## 2021-06-08 HISTORY — DX: Nicotine dependence, unspecified, uncomplicated: F17.200

## 2021-06-08 LAB — HM HEPATITIS C SCREENING LAB: HM Hepatitis Screen: NEGATIVE

## 2021-06-08 LAB — WET PREP FOR TRICH, YEAST, CLUE
Trichomonas Exam: NEGATIVE
Yeast Exam: NEGATIVE

## 2021-06-08 LAB — HM HIV SCREENING LAB: HM HIV Screening: NEGATIVE

## 2021-06-08 NOTE — Progress Notes (Signed)
Wet Mount results reviewed. Per standing orders no treatment indicated. Yardley Beltran, RN  

## 2021-06-08 NOTE — Progress Notes (Signed)
Here today for PE and STD screening. Last PE and Pap Smear (NIL) here was 04/05/2020. Nexplanon removed 04/14/2020. Wants STD testing including bloodwork. Declines birth control, desires a pregnancy within the next year. Tawny Hopping, RN

## 2021-06-08 NOTE — Progress Notes (Signed)
[facility] DEPARTMENT [facility] [address] Main Number: [phone]    Family Planning Visit- Initial Visit  Subjective:  Bailey Davis is a 25 y.o. SWF smoker G1P0010   being seen today for an initial annual visit and to discuss contraceptive options.  The patient is currently using None for pregnancy prevention. Patient reports she does want a pregnancy in the next year.  Patient has the following medical conditions has Methamphetamine abuse (HCC); Smoker 1 ppd; Trichomonas infection 04/05/20; Marijuana use; and Vapes nicotine containing substance on their problem list.  Chief Complaint  Patient presents with   Gynecologic Exam    Patient reports wants physical and wants to conceive. Last PE 04/05/20. Last pap 04/05/20 neg +trich. LMP 05/16/21. Last sex 06/06/21 without condom; with current partner x1 year; 1 partner in last 3 mo. Smoking 1 ppd. Last vaped last week. Last cigar 1 year ago. Last MJ 04/2021. Last ETOH 05/2021 (1 wine cooler) "none since".  Vague historian.  Last meth 1 week ago (1-2x/wk) with onset age 45. Last cocaine 2021 with onset age 53.  Nexplanon removed 04/14/20. Living with her Dad. Not employed, not in school.  Patient denies   Body mass index is 21.25 kg/m. - Patient is eligible for diabetes screening based on BMI and age >29?  not applicable HA1C ordered? not applicable  Patient reports 2  partner/s in last year. Desires STI screening?  Yes  Has patient been screened once for HCV in the past?  No  No results found for: HCVAB  Does the patient have current drug use (including MJ), have a partner with drug use, and/or has been incarcerated since last result? Yes  If yes-- Screen for HCV through [facility]   Does the patient meet criteria for HBV testing? Yes  Criteria:  -Household, sexual or needle sharing contact with HBV -History of drug use -HIV positive -Those with known Hep C   Health Maintenance Due   Topic Date Due   COVID-19 Vaccine (1) Never done   Pneumococcal Vaccine 80-32 Years old (1 - PCV) Never done   HPV VACCINES (1 - 2-dose series) Never done   HIV Screening  Never done   Hepatitis C Screening  Never done   INFLUENZA VACCINE  05/09/2021    Review of Systems  Cardiovascular:  Positive for leg swelling (c/o feet edema x 2-3 mo; referred to primary care MD).  Endo/Heme/Allergies:  Bruises/bleeds easily (onset age 16; referred to primary care MD).  All other systems reviewed and are negative.  The following portions of the patient's history were reviewed and updated as appropriate: allergies, current medications, past family history, past medical history, past social history, past surgical history and problem list. Problem list updated.   See flowsheet for other program required questions.  Objective:   Vitals:   06/08/21 1051  BP: 124/73  Weight: 116 lb 3.2 oz (52.7 kg)  Height: 5\' 2"  (1.575 m)    Physical Exam Constitutional:      Appearance: Normal appearance. She is normal weight.  HENT:     Head: Normocephalic and atraumatic.     Mouth/Throat:     Mouth: Mucous membranes are moist.     Pharynx: Pharyngeal swelling, oropharyngeal exudate, posterior oropharyngeal erythema and uvula swelling present.     Tonsils: No tonsillar exudate or tonsillar abscesses.  Eyes:     Conjunctiva/sclera: Conjunctivae normal.  Cardiovascular:     Rate and Rhythm: Normal rate  and regular rhythm.  Pulmonary:     Effort: Pulmonary effort is normal.     Breath sounds: Normal breath sounds.  Chest:  Breasts:    Right: Normal.     Left: Normal.     Comments: Bilateral pierced nipples Abdominal:     Palpations: Abdomen is soft.     Comments: Soft without masses or tenderness, scaphoid, good tone  Genitourinary:    General: Normal vulva.     Exam position: Lithotomy position.     Vagina: Vaginal discharge (white creamy leukorrhea, ph equivocal, with malodor; pt c/o thick  white malodorous leukorrhea onset 2 mo ago) present.     Cervix: Normal.     Uterus: Normal.      Adnexa: Right adnexa normal and left adnexa normal.     Rectum: Normal.       Comments: Cx deviated to right Shallow lesion on left labia majora that is mostly healed but tender with erythema surrounding it. Pt vague historian and states has had this lesion x 1 year and it does not come and go because she "messes with it plucking hair out of it, shaving, squeezing the pimple". Pt states it never heals because she picks at it. Labia is shaven without hair  Musculoskeletal:        General: Normal range of motion.     Cervical back: Normal range of motion and neck supple.  Skin:    General: Skin is warm and dry.  Neurological:     Mental Status: She is alert.  Psychiatric:        Mood and Affect: Mood normal.      Assessment and Plan:  Charmion A Reihl is a 25 y.o. female presenting to the [facility] Department for an initial annual wellness/contraceptive visit  Contraception counseling: Reviewed all forms of birth control options in the tiered based approach. available including abstinence; over the counter/barrier methods; hormonal contraceptive medication including pill, patch, ring, injection,contraceptive implant, ECP; hormonal and nonhormonal IUDs; permanent sterilization options including vasectomy and the various tubal sterilization modalities. Risks, benefits, and typical effectiveness rates were reviewed.  Questions were answered.  Written information was also given to the patient to review.  Patient desires nothing, this was prescribed for patient. She will follow up in  prn for surveillance.  She was told to call with any further questions, or with any concerns about this method of contraception.  Emphasized use of condoms 100% of the time for STI prevention.  Patient was offered ECP. ECP was not accepted by the patient. ECP counseling was not given - see RN  documentation  1. Family planning Counseled via 5 A's to stop smoking, vaping, drug use, ETOH Preconception counseling done Treat wet mount per standing orders Immunization nurse consult Please give primary care MD list to pt - WET PREP FOR TRICH, YEAST, CLUE - Chlamydia/Gonorrhea [place] [facility] - Virology, [place] [facility] - Gonococcus culture  2. Smoker 1 ppd Counseled to stop  3. Trichomonas infection 04/05/20   4. Marijuana use Last use 04/2021   5. Vapes nicotine containing substance      No follow-ups on file.  No future appointments.  Alberteen Spindle, CNM

## 2021-06-13 LAB — GONOCOCCUS CULTURE

## 2021-10-07 ENCOUNTER — Ambulatory Visit: Payer: Self-pay

## 2021-10-07 ENCOUNTER — Ambulatory Visit: Payer: Managed Care, Other (non HMO)

## 2021-11-17 ENCOUNTER — Other Ambulatory Visit: Payer: Self-pay

## 2021-11-17 ENCOUNTER — Ambulatory Visit (LOCAL_COMMUNITY_HEALTH_CENTER): Payer: Self-pay

## 2021-11-17 VITALS — BP 120/75 | HR 92 | Ht 62.0 in | Wt 112.5 lb

## 2021-11-17 DIAGNOSIS — Z3201 Encounter for pregnancy test, result positive: Secondary | ICD-10-CM

## 2021-11-17 LAB — PREGNANCY, URINE: Preg Test, Ur: POSITIVE — AB

## 2021-11-17 MED ORDER — PRENATAL VITAMIN 27-0.8 MG PO TABS: 1.0000 | ORAL_TABLET | Freq: Every day | ORAL | 0 refills | Status: AC

## 2021-11-17 NOTE — Progress Notes (Signed)
Plans prenatal care at [facility] with [facility] as delivery provider. States needs to apply for Medicaid and encouraged her to do that ASAP. Jossie Ng, RN

## 2021-11-28 ENCOUNTER — Telehealth: Payer: Self-pay

## 2021-11-28 NOTE — Telephone Encounter (Signed)
Per Epic appt desk, client has new OB rescheduled for 12/06/2021. Jossie Ng, RN

## 2021-11-28 NOTE — Telephone Encounter (Signed)
Call to client to reschedule [facility] IP appt due to provider availability. Left message to call with number provided. Jossie Ng, RN

## 2021-12-06 ENCOUNTER — Encounter: Payer: Self-pay | Admitting: Advanced Practice Midwife

## 2021-12-06 ENCOUNTER — Ambulatory Visit: Payer: Medicaid Other | Admitting: Nurse Practitioner

## 2021-12-06 ENCOUNTER — Other Ambulatory Visit: Payer: Self-pay

## 2021-12-06 VITALS — BP 118/72 | HR 74 | Temp 98.2°F | Wt 116.2 lb

## 2021-12-06 DIAGNOSIS — O0990 Supervision of high risk pregnancy, unspecified, unspecified trimester: Secondary | ICD-10-CM

## 2021-12-06 DIAGNOSIS — O0992 Supervision of high risk pregnancy, unspecified, second trimester: Secondary | ICD-10-CM | POA: Diagnosis not present

## 2021-12-06 DIAGNOSIS — Z789 Other specified health status: Secondary | ICD-10-CM | POA: Insufficient documentation

## 2021-12-06 DIAGNOSIS — O99322 Drug use complicating pregnancy, second trimester: Secondary | ICD-10-CM

## 2021-12-06 DIAGNOSIS — F17200 Nicotine dependence, unspecified, uncomplicated: Secondary | ICD-10-CM | POA: Insufficient documentation

## 2021-12-06 DIAGNOSIS — O99320 Drug use complicating pregnancy, unspecified trimester: Secondary | ICD-10-CM | POA: Insufficient documentation

## 2021-12-06 DIAGNOSIS — Z8759 Personal history of other complications of pregnancy, childbirth and the puerperium: Secondary | ICD-10-CM | POA: Insufficient documentation

## 2021-12-06 LAB — URINALYSIS
Bilirubin, UA: NEGATIVE
Glucose, UA: NEGATIVE
Ketones, UA: NEGATIVE
Leukocytes,UA: NEGATIVE
Nitrite, UA: NEGATIVE
Protein,UA: NEGATIVE
RBC, UA: NEGATIVE
Specific Gravity, UA: 1.03 (ref 1.005–1.030)
Urobilinogen, Ur: 0.2 mg/dL (ref 0.2–1.0)
pH, UA: 6 (ref 5.0–7.5)

## 2021-12-06 LAB — HEMOGLOBIN, FINGERSTICK: Hemoglobin: 12.1 g/dL (ref 11.1–15.9)

## 2021-12-06 LAB — WET PREP FOR TRICH, YEAST, CLUE
Trichomonas Exam: NEGATIVE
Yeast Exam: NEGATIVE

## 2021-12-06 NOTE — Progress Notes (Addendum)
Here today for a 20.0 week MH IP appt. Taking PNV QD. Denies ED/hospital visits since +PT. Declines Flu vaccine. Tawny Hopping, RN

## 2021-12-06 NOTE — Progress Notes (Signed)
Hgb, UA and wet mount all reviewed while in clinic. Per standing orders no treatment indicated. Instructed patient that [facility] will be calling her to schedule ultrasound and to make sure she answers her phone. Tawny Hopping, RN

## 2021-12-06 NOTE — Progress Notes (Signed)
[facility] Department  Maternal Health Clinic   INITIAL PRENATAL VISIT NOTE  Subjective:  Bailey Davis is a 26 y.o. G2P0010 at [redacted]w[redacted]d being seen today to start prenatal care at the [facility] Department.  She is currently monitored for the following issues for this high-risk pregnancy and has Methamphetamine abuse (HCC) 11/2018 +UDS; Smoker 1 ppd; Trichomonas infection 04/05/20; Marijuana use; Vapes nicotine containing substance; Substance abuse (HCC) (cocaine and meth 1-2x/wk); Poor historian; and Supervision of high risk pregnancy, antepartum on their problem list.  Patient reports no complaints.  Contractions: Not present. Vag. Bleeding: None.  Movement: Present. Denies leaking of fluid.   Indications for ASA therapy (per uptodate) One of the following: Previous pregnancy with preeclampsia, especially early onset and with an adverse outcome No Multifetal gestation No Chronic hypertension No Type 1 or 2 diabetes mellitus No Chronic kidney disease No Autoimmune disease (antiphospholipid syndrome, systemic lupus erythematosus) No  Two or more of the following: Nulliparity No Obesity (body mass index >30 kg/m2) No Family history of preeclampsia in mother or sister No Age ?35 years No Sociodemographic characteristics (African American race, low socioeconomic level) No Personal risk factors (eg, previous pregnancy with low birth weight or small for gestational age infant, previous adverse pregnancy outcome [eg, stillbirth], interval >10 years between pregnancies) No   The following portions of the patient's history were reviewed and updated as appropriate: allergies, current medications, past family history, past medical history, past social history, past surgical history and problem list. Problem list updated.  Objective:   Vitals:   12/06/21 1350  BP: 118/72  Pulse: 74  Temp: 98.2 F (36.8 C)  Weight: 116 lb 3.2 oz (52.7 kg)    Fetal Status: Fetal Heart  Rate (bpm): not heard    Movement: Present  Presentation: Undeterminable   Physical Exam Vitals and nursing note reviewed.  Constitutional:      General: She is not in acute distress.    Appearance: Normal appearance. She is well-developed.  HENT:     Head: Normocephalic and atraumatic.     Right Ear: External ear normal.     Left Ear: External ear normal.     Nose: Nose normal. No congestion or rhinorrhea.     Mouth/Throat:     Lips: Pink.     Mouth: Mucous membranes are moist.     Dentition: Normal dentition. No dental caries.     Pharynx: Oropharynx is clear. Uvula midline.     Comments: Dentition: No visible signs of dental caries Eyes:     General: No scleral icterus.    Conjunctiva/sclera: Conjunctivae normal.     Pupils: Pupils are equal, round, and reactive to light.  Neck:     Thyroid: No thyroid mass or thyromegaly.  Cardiovascular:     Rate and Rhythm: Normal rate and regular rhythm.     Pulses: Normal pulses.     Comments: Extremities are warm and well perfused Pulmonary:     Effort: Pulmonary effort is normal.     Breath sounds: Normal breath sounds.  Chest:     Chest wall: No mass.  Breasts:    Tanner Score is 5.     Breasts are symmetrical.     Right: Normal. No mass, nipple discharge or skin change.     Left: Normal. No mass, nipple discharge or skin change.  Abdominal:     General: Abdomen is flat. Bowel sounds are normal.     Palpations: Abdomen is soft.  Tenderness: There is no abdominal tenderness.     Comments: Gravid   Genitourinary:    General: Normal vulva.     Exam position: Lithotomy position.     Pubic Area: No rash.      Labia:        Right: No rash.        Left: No rash.      Vagina: Normal. No vaginal discharge.     Cervix: No cervical motion tenderness or friability.     Uterus: Normal. Enlarged (Size undeterminable). Not tender.      Adnexa: Right adnexa normal and left adnexa normal.     Rectum: Normal. No external hemorrhoid.      Comments: External genitalia/pubic area without nits, lice, edema, erythema, lesions and inguinal adenopathy. Vagina with normal mucosa and discharge. Cervix without visible lesions. Uterus firm, mobile, nt, no masses, no CMT, no adnexal tenderness or fullness.  Musculoskeletal:     Cervical back: Full passive range of motion without pain, normal range of motion and neck supple.     Right lower leg: No edema.     Left lower leg: No edema.  Lymphadenopathy:     Cervical: No cervical adenopathy.     Upper Body:     Right upper body: No axillary adenopathy.     Left upper body: No axillary adenopathy.  Skin:    General: Skin is warm and dry.     Capillary Refill: Capillary refill takes less than 2 seconds.  Neurological:     Mental Status: She is alert and oriented to person, place, and time.  Psychiatric:        Attention and Perception: Attention normal.        Mood and Affect: Mood normal.        Speech: Speech normal.        Behavior: Behavior is cooperative.    Assessment and Plan:  Pregnancy: G2P0010 at [redacted]w[redacted]d  1. Supervision of high risk pregnancy, antepartum -26 year old female in clinic today for her prenatal visit. -Patient states she is taking her PNV daily. -Reviewed with patient today weight gain for pregnancy. -Reviewed measures to control nausea and vomiting. -Fetal heart tones and size undeterminable at this visit.  Will send patient for an ultrasound to confirm dating and anatomy scan.  Patient has an unsure LMP.  -Patient states she has had mixed emotions about the pregnancy.  She is excited to become a mother. -Currently living with grandparents. -Patient is unsure about paternity of the child.  Both potential fathers are made aware.   -Patient is currently not working. -Patient currently has no complaints.   -No recent ED visits. -Patient only takes Tylenol as needed, no additional medications.  -Declines genetic counseling today.  Agrees to AFP testing.    -PHQ-9- 5 -Patient states she has not received any COVID vaccines.  Given information about [facility] COVID clinic.     - Lead, blood (adult age 14 yrs or greater) - Hemoglobinopathy evaluation [phone] - HIV-1/HIV-2 Qualitative RNA - Prenatal profile without Varicella/Rubella ([id number]) - Urine Culture - Chlamydia/GC NAA, Confirmation - [id number] Drug Screen - WET PREP FOR TRICH, YEAST, CLUE - Hemoglobin, venipuncture - Urinalysis (Urine Dip) - HCV Ab w Reflex to Quant PCR  2. Drug use affecting pregnancy, antepartum --Patient has an extensive history of drug use.  Patient currently receiving services through [facility], where she is required to complete 8 classes, group counseling sessions, and provide frequent UDS.  Patient  provided with information about [facility] and Kathreen Cosier, [place] with [facility].  -Patient admits to using Meth once a day.   3. Smoking -Patient smokes 2-4 cigarettes a day.  Reviewed with patient the impact and side effects smoking has on pregnancies.    4. Alcohol use -Last alcohol use 2 months ago.     Discussed overview of care and coordination with inpatient delivery practices including [facility], [facility], [facility] and [facility].     Term labor symptoms and general obstetric precautions including but not limited to vaginal bleeding, contractions, leaking of fluid and fetal movement were reviewed in detail with the patient.  Please refer to After Visit Summary for other counseling recommendations.   Return in about 4 weeks (around 01/03/2022) for Routine prenatal care visit.  Future Appointments  Date Time Provider Department Center  01/03/2022  1:20 PM [facility] PROVIDER [facility] None          Glenna Fellows, FNP

## 2021-12-07 LAB — CBC/D/PLT+RPR+RH+ABO+AB SCR
Antibody Screen: NEGATIVE
Basophils Absolute: 0 10*3/uL (ref 0.0–0.2)
Basos: 1 %
EOS (ABSOLUTE): 0.3 10*3/uL (ref 0.0–0.4)
Eos: 6 %
Hematocrit: 34.9 % (ref 34.0–46.6)
Hemoglobin: 12.1 g/dL (ref 11.1–15.9)
Hepatitis B Surface Ag: NEGATIVE
Immature Grans (Abs): 0 10*3/uL (ref 0.0–0.1)
Immature Granulocytes: 0 %
Lymphocytes Absolute: 1.3 10*3/uL (ref 0.7–3.1)
Lymphs: 24 %
MCH: 30.7 pg (ref 26.6–33.0)
MCHC: 34.7 g/dL (ref 31.5–35.7)
MCV: 89 fL (ref 79–97)
Monocytes Absolute: 0.5 10*3/uL (ref 0.1–0.9)
Monocytes: 9 %
Neutrophils Absolute: 3.1 10*3/uL (ref 1.4–7.0)
Neutrophils: 60 %
Platelets: 253 10*3/uL (ref 150–450)
RBC: 3.94 x10E6/uL (ref 3.77–5.28)
RDW: 12.9 % (ref 11.7–15.4)
RPR Ser Ql: NONREACTIVE
Rh Factor: POSITIVE
WBC: 5.2 10*3/uL (ref 3.4–10.8)

## 2021-12-07 LAB — LEAD, BLOOD (ADULT >= 16 YRS): Lead-Whole Blood: <1 ug/dL (ref 0.0–3.4)

## 2021-12-07 LAB — HCV INTERPRETATION

## 2021-12-07 LAB — HCV AB W REFLEX TO QUANT PCR: HCV Ab: NONREACTIVE

## 2021-12-08 ENCOUNTER — Telehealth: Payer: Self-pay

## 2021-12-08 LAB — CHLAMYDIA/GC NAA, CONFIRMATION
Chlamydia trachomatis, NAA: NEGATIVE
Neisseria gonorrhoeae, NAA: NEGATIVE

## 2021-12-08 NOTE — Telephone Encounter (Signed)
Patient called clinic stating that she needed an earlier ultrasound that is currently scheduled "because when she was here we could not get her baby's heartbeat." Patient continued to state that [facility] had called her and scheduled her ultrasound for 12/28/21 but that she needed an ultrasound before then. RN consulted with Ola Spurr, CNM regarding above patient concern. New ultrasound referral completed for ASAP viability scan request and faxed to [facility]. Hal Morales, RN ? ?

## 2021-12-09 ENCOUNTER — Telehealth: Payer: Self-pay

## 2021-12-09 LAB — URINE CULTURE

## 2021-12-09 LAB — HGB FRACTIONATION CASCADE
Hgb A2: 2.8 % (ref 1.8–3.2)
Hgb A: 97.2 % (ref 96.4–98.8)
Hgb F: 0 % (ref 0.0–2.0)
Hgb S: 0 %

## 2021-12-09 LAB — HIV-1/HIV-2 QUALITATIVE RNA
HIV-1 RNA, Qualitative: NONREACTIVE
HIV-2 RNA, Qualitative: NONREACTIVE

## 2021-12-09 NOTE — Telephone Encounter (Signed)
Patient states she would like an ultrasound sooner that what [facility] could provider, ultrasound report re-faxed by RN with ASAP status on 12/08/21.  ?Ultrasound schedulers called to see if they have a sooner appointment with [place] for anatomy/viability and per [facility] scheduler, Selena Batten, soonest available was the one given to patient for 12/28/21 at 10am.  ? ?Floy Sabina, RN ? ?

## 2021-12-09 NOTE — Telephone Encounter (Signed)
Per patient request for a sooner [place] appointment, RN called [facility] schedulers and spoke with Selena Batten for a sooner appointment for an ultrasound and per Selena Batten that is the earliest available for 12/28/21.  ? ?Floy Sabina, RN ? ?

## 2021-12-12 ENCOUNTER — Telehealth: Payer: Self-pay

## 2021-12-12 NOTE — Telephone Encounter (Signed)
TC to patient to inform of UTI and to schedule appointment for treatment. LM on home phone and on cell phone numbers. Number to call given in message.Burt Knack, RN  ?

## 2021-12-13 NOTE — Telephone Encounter (Signed)
Telephone call to patient.  Patient is aware of her UTI and the need for treatment with a provider.  Patient will call back today at 1 pm to schedule appointment. Appointment line number given [phone].  Hart Carwin, RN ? ?

## 2021-12-13 NOTE — Telephone Encounter (Signed)
Call to client at home and cell numbers as not yet scheduled appt for UTI treatment. Left message to call and schedule appt with number to call provided. Jossie Ng, RN ? ?

## 2021-12-13 NOTE — Telephone Encounter (Signed)
Client scheduled OB Problem for 12/15/2021 (UTI treatment). Jossie Ng, RN ? ?

## 2021-12-15 ENCOUNTER — Ambulatory Visit: Payer: Medicaid Other | Admitting: Physician Assistant

## 2021-12-15 ENCOUNTER — Telehealth: Payer: Self-pay | Admitting: Family Medicine

## 2021-12-15 ENCOUNTER — Encounter: Payer: Self-pay | Admitting: Physician Assistant

## 2021-12-15 ENCOUNTER — Other Ambulatory Visit: Payer: Self-pay

## 2021-12-15 VITALS — BP 119/79 | HR 89 | Temp 97.9°F | Wt 125.0 lb

## 2021-12-15 DIAGNOSIS — O0990 Supervision of high risk pregnancy, unspecified, unspecified trimester: Secondary | ICD-10-CM

## 2021-12-15 DIAGNOSIS — O2340 Unspecified infection of urinary tract in pregnancy, unspecified trimester: Secondary | ICD-10-CM | POA: Insufficient documentation

## 2021-12-15 DIAGNOSIS — O0992 Supervision of high risk pregnancy, unspecified, second trimester: Secondary | ICD-10-CM

## 2021-12-15 DIAGNOSIS — O2342 Unspecified infection of urinary tract in pregnancy, second trimester: Secondary | ICD-10-CM

## 2021-12-15 LAB — 789231 7+OXYCODONE-BUND
BENZODIAZ UR QL: NEGATIVE ng/mL
Barbiturate screen, urine: NEGATIVE ng/mL
Cannabinoid Quant, Ur: NEGATIVE ng/mL
Cocaine (Metab.): NEGATIVE ng/mL
OPIATE SCREEN URINE: NEGATIVE ng/mL
Oxycodone/Oxymorphone, Urine: NEGATIVE ng/mL
PCP Quant, Ur: NEGATIVE ng/mL

## 2021-12-15 LAB — AMPHETAMINE CONF, UR
Amphetamine GC/MS Conf: 11440 ng/mL
Amphetamine: POSITIVE — AB
Amphetamines: POSITIVE — AB
Methamphetamine Quant, Ur: 84425 ng/mL
Methamphetamine: POSITIVE — AB

## 2021-12-15 MED ORDER — NITROFURANTOIN MONOHYD MACRO 100 MG PO CAPS: 100.0000 mg | ORAL_CAPSULE | Freq: Two times a day (BID) | ORAL | 0 refills | Status: DC

## 2021-12-15 NOTE — Progress Notes (Signed)
[facility] Department ?Maternal Health Clinic ? ?PRENATAL VISIT NOTE ? ?Subjective:  ?Bailey Davis is a 26 y.o. G2P0010 at [redacted]w[redacted]d being seen today for UTI treatment.  She is currently monitored for the following issues for this high-risk pregnancy and has Methamphetamine abuse ([place]) 11/2018 +UDS; Smoker 1 ppd; Trichomonas infection 04/05/20; Marijuana use; Vapes nicotine containing substance; Substance abuse ([place]) (cocaine and meth 1-2x/wk); Poor historian; Supervision of high risk pregnancy, antepartum; Drug use affecting pregnancy, antepartum; Smoking; Alcohol use; History of miscarriage; and Urinary tract infection in mother during pregnancy, antepartum on their problem list. ? ?Patient reports no complaints.   .  .   . Denies leaking of fluid/ROM.  ? ?The following portions of the patient's history were reviewed and updated as appropriate: allergies, current medications, past family history, past medical history, past social history, past surgical history and problem list. Problem list updated. ? ?Objective:  ? ?Vitals:  ? 12/15/21 1616  ?BP: 119/79  ?Pulse: 89  ?Temp: 97.9 ?F (36.6 ?C)  ?Weight: 125 lb (56.7 kg)  ? ? ?Fetal Status:          ? ?General:  Alert, oriented and cooperative. Patient is in no acute distress.  ?Skin: Skin is warm and dry. No rash noted.   ?Cardiovascular: Normal heart rate noted  ?Respiratory: Normal respiratory effort, no problems with respiration noted  ?Abdomen: Soft, gravid, appropriate for gestational age.        ?Pelvic: Cervical exam deferred        ?Extremities: Normal range of motion.     ?Mental Status: Normal mood and affect. Normal behavior. Normal judgment and thought content.  ?No CVAT or suprapubic tenderness noted on abdominal exam. ?Assessment and Plan:  ?Pregnancy: G2P0010 at [redacted]w[redacted]d ? ?1. Supervision of high risk pregnancy, antepartum ?Enc to keep next exam as sched. ? ?2. Urinary tract infection in mother during pregnancy, antepartum ?Reviewed 12/06/21 U  C&S showing 50-100k E coli. Treat with nitrofurantoin 100mg  po bid for 7 days. Enc po liquids. Plan TOC via repeat U C&S at prenatal RV. ? ? ?Preterm labor symptoms and general obstetric precautions including but not limited to vaginal bleeding, contractions, leaking of fluid and fetal movement were reviewed in detail with the patient. ?Please refer to After Visit Summary for other counseling recommendations.  ?Return for Routine prenatal care as sched 01/03/22. ? ?Future Appointments  ?Date Time Provider [place]  ?01/03/2022  1:20 PM [facility] PROVIDER [facility] None  ? ? ?Lora Havens, PA-C ? ?

## 2021-12-15 NOTE — Telephone Encounter (Signed)
Pt does not have a ride for her MH- OB Problem appt this afternoon. She will try to make it later today, BUT asks if she could either be seen as a work in tomorrow (no available appts until mid-next week), or if she could be prescribed something for her UTI? Please call her to her home phone. Thanks ?

## 2021-12-15 NOTE — Progress Notes (Signed)
Presents to clinic for UTI treatment. Aware of [name] [place] 12/28/2021 at 1000.  Encouraged to set alarm on phone as reminder to take PNV QD. Rich Number, RN ? ?

## 2021-12-15 NOTE — Telephone Encounter (Signed)
Call to client and left message that we could see her in [facility] later today or schedule her an appt for tomorrow. Number to call provided. (If client desires appt 12/16/2021, RN will overbook appt as need E. Coli UTI treated). Jossie Ng, RN ? ?

## 2021-12-15 NOTE — Telephone Encounter (Signed)
Consulted on the plan of care for this client.  I agree with the documented note and actions taken to provide care for this client.  E. Pavan Bring, CNM  

## 2021-12-16 NOTE — Telephone Encounter (Signed)
Patient kept her UTI [place] appointment yesterday 12-15-21 at 1:40 pm.  Hart Carwin, RN ? ?

## 2022-01-02 ENCOUNTER — Encounter: Payer: Self-pay | Admitting: Advanced Practice Midwife

## 2022-01-02 DIAGNOSIS — R825 Elevated urine levels of drugs, medicaments and biological substances: Secondary | ICD-10-CM

## 2022-01-03 ENCOUNTER — Other Ambulatory Visit: Payer: Self-pay

## 2022-01-03 ENCOUNTER — Ambulatory Visit: Payer: Medicaid Other | Admitting: Advanced Practice Midwife

## 2022-01-03 VITALS — BP 120/71 | HR 97 | Temp 98.6°F | Wt 127.0 lb

## 2022-01-03 DIAGNOSIS — O0990 Supervision of high risk pregnancy, unspecified, unspecified trimester: Secondary | ICD-10-CM

## 2022-01-03 DIAGNOSIS — O0992 Supervision of high risk pregnancy, unspecified, second trimester: Secondary | ICD-10-CM

## 2022-01-03 DIAGNOSIS — O99322 Drug use complicating pregnancy, second trimester: Secondary | ICD-10-CM

## 2022-01-03 DIAGNOSIS — O99320 Drug use complicating pregnancy, unspecified trimester: Secondary | ICD-10-CM

## 2022-01-03 DIAGNOSIS — Z789 Other specified health status: Secondary | ICD-10-CM

## 2022-01-03 DIAGNOSIS — F1910 Other psychoactive substance abuse, uncomplicated: Secondary | ICD-10-CM

## 2022-01-03 DIAGNOSIS — O2342 Unspecified infection of urinary tract in pregnancy, second trimester: Secondary | ICD-10-CM

## 2022-01-03 DIAGNOSIS — F17200 Nicotine dependence, unspecified, uncomplicated: Secondary | ICD-10-CM

## 2022-01-03 DIAGNOSIS — O2340 Unspecified infection of urinary tract in pregnancy, unspecified trimester: Secondary | ICD-10-CM

## 2022-01-03 DIAGNOSIS — R825 Elevated urine levels of drugs, medicaments and biological substances: Secondary | ICD-10-CM

## 2022-01-03 DIAGNOSIS — F1290 Cannabis use, unspecified, uncomplicated: Secondary | ICD-10-CM

## 2022-01-03 NOTE — Progress Notes (Signed)
[facility] Department ?Maternal Health Clinic ? ?PRENATAL VISIT NOTE ? ?Subjective:  ?Bailey Davis is a 26 y.o. G2P0010 at [redacted]w[redacted]d being seen today for ongoing prenatal care.  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 ? ?Patient reports no complaints.  Contractions: Not present. Vag. Bleeding: None.  Movement: Absent. Denies leaking of fluid/ROM.  ? ?The following portions of the patient's history were reviewed and updated as appropriate: allergies, current medications, past family history, past medical history, past social history, past surgical history and problem list. Problem list updated. ? ?Objective:  ? ?Vitals:  ? 01/03/22 1345  ?BP: 120/71  ?Pulse: 97  ?Temp: 98.6 ?F (37 ?C)  ?Weight: 127 lb (57.6 kg)  ? ? ?Fetal Status: Fetal Heart Rate (bpm): 160 Fundal Height: 12 cm Movement: Absent    ? ?General:  Alert, oriented and cooperative. Patient is in no acute distress.  ?Skin: Skin is warm and dry. No rash noted.   ?Cardiovascular: Normal heart rate noted  ?Respiratory: Normal respiratory effort, no problems with respiration noted  ?Abdomen: Soft, gravid, appropriate for gestational age.  Pain/Pressure: Absent     ?Pelvic: Cervical exam deferred        ?Extremities: Normal range of motion.  Edema: None  ?Mental Status: Normal mood and affect. Normal behavior. Normal judgment and thought content.  ? ?Assessment and Plan:  ?Pregnancy: G2P0010 at [redacted]w[redacted]d ? ?1. Urinary tract  infection in mother during pregnancy, 12/06/21 ?Dx'd 12/06/21. Given Macrobid tx on 12/15/21. Pt lost bottle for 2 days and then inconsistent noncompliance ?C&S TOC today ? ?2. Drug use affecting pregnancy, antepartum ? ? ?3. 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 ?12. Positive urine drug screen: 12/06/21 +meth,+amphetamine; 02/29/20 +cocaine, +amphetamine; 12/03/18 +amphetamine ?Agrees to UDS today ? ?5. Marijuana use ?Last use 10/02/21 onset age 82 ? ?43. Smoker 1 ppd ?Counseled via 5 A's to stop smoking cigs ?States is now smoking 1/2-1 ppd ?Trying to stop vaping; no vaping in last week ? ? ?7. Alcohol use ?Last use 12/06/21 (1 wine cooler). Counseled not to drink ? ?8. Supervision of high risk pregnancy, antepartum ?9 lb (4.082 kg) ?Not working and not in school. Here with FOB Bailey Davis, who as provider was entering room, FOB was using autoscope to look into patient's ear ?Living with her dad.  Walking to the store due to no transportation 3-4x/wk x 10-15 min--encouraged [employer] at the [place] ?11 lb wt gain in past 4 wks. ?Wants Quad screen ?Reviewed 12/28/21 u/s at 11 6/7 with AFI wnl, and EDC change to 07/13/22. ?Next u/s is 02/15/22 per pt ?- Urine Culture & Sensitivity ?- [id number] Drug Screen ?- Urinalysis (Urine Dip) ? ? ?Preterm labor symptoms and general obstetric precautions including but not limited to vaginal bleeding, contractions, leaking of fluid and fetal movement were reviewed in detail with the patient. ?Please refer to After Visit Summary for other counseling recommendations.  ?Return in about 4 weeks (around 01/31/2022) for routine PNC. ? ?  Future Appointments  ?Date Time Provider [place]  ?01/31/2022  2:00 PM AC-MH PROVIDER AC-MAT None  ? ? ?Herbie Saxon, CNM ?

## 2022-01-03 NOTE — Progress Notes (Signed)
12 5/7 GA. Client left clinic prior to [facility] handout (recommended by provider). ? ?
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
## 2022-01-10 LAB — 789231 7+OXYCODONE-BUND
BENZODIAZ UR QL: NEGATIVE ng/mL
Barbiturate screen, urine: NEGATIVE ng/mL
Cannabinoid Quant, Ur: NEGATIVE ng/mL
Cocaine (Metab.): NEGATIVE ng/mL
OPIATE SCREEN URINE: NEGATIVE ng/mL
Oxycodone/Oxymorphone, Urine: NEGATIVE ng/mL
PCP Quant, Ur: NEGATIVE ng/mL

## 2022-01-10 LAB — URINALYSIS
Bilirubin, UA: NEGATIVE
Glucose, UA: NEGATIVE
Leukocytes,UA: NEGATIVE
Nitrite, UA: NEGATIVE
RBC, UA: NEGATIVE
Specific Gravity, UA: 1.024 (ref 1.005–1.030)
Urobilinogen, Ur: 0.2 mg/dL (ref 0.2–1.0)
pH, UA: 5.5 (ref 5.0–7.5)

## 2022-01-10 LAB — URINE CULTURE

## 2022-01-10 LAB — AMPHETAMINE CONF, UR
Amphetamine GC/MS Conf: 2183 ng/mL
Amphetamine: POSITIVE — AB
Amphetamines: POSITIVE — AB
Methamphetamine Quant, Ur: >3000 ng/mL
Methamphetamine: POSITIVE — AB
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
## 2022-01-31 ENCOUNTER — Ambulatory Visit: Payer: Medicaid Other

## 2022-02-02 ENCOUNTER — Encounter: Payer: Self-pay | Admitting: Physician Assistant

## 2022-02-02 ENCOUNTER — Ambulatory Visit: Payer: Medicaid Other | Admitting: Physician Assistant

## 2022-02-02 VITALS — BP 101/61 | HR 74 | Temp 97.1°F | Wt 127.2 lb

## 2022-02-02 DIAGNOSIS — F17200 Nicotine dependence, unspecified, uncomplicated: Secondary | ICD-10-CM

## 2022-02-02 DIAGNOSIS — O99322 Drug use complicating pregnancy, second trimester: Secondary | ICD-10-CM

## 2022-02-02 DIAGNOSIS — O0990 Supervision of high risk pregnancy, unspecified, unspecified trimester: Secondary | ICD-10-CM

## 2022-02-02 DIAGNOSIS — O99320 Drug use complicating pregnancy, unspecified trimester: Secondary | ICD-10-CM

## 2022-02-02 DIAGNOSIS — O0992 Supervision of high risk pregnancy, unspecified, second trimester: Secondary | ICD-10-CM

## 2022-02-02 NOTE — Progress Notes (Signed)
Here today for 17.0 week MH RV. States "taking PNV when I remember." Denies ED/hospital visits since last appt. Aware of scheduled [name] anatomy scan for 02/15/22. Wants Quad screen today. Tawny Hopping, RN ? ?

## 2022-02-02 NOTE — Progress Notes (Signed)
[facility] Department ?Maternal Health Clinic ? ?PRENATAL VISIT NOTE ? ?Subjective:  ?Bailey Davis is a 26 y.o. G2P0010 at [redacted]w[redacted]d being seen today for ongoing prenatal care.  Here with Billy. 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 ? ?Patient reports  occ upper abdominal discomfort, not always same location, never severe or prolonged .  Contractions: Not present. Vag. Bleeding: None.  Movement: Absent. Denies leaking of fluid/ROM.  ? ?The following portions of the patient's history were reviewed and updated as appropriate: allergies, current medications, past family history, past medical history, past social history, past surgical history and problem list. Problem list updated. ? ?Objective:  ? ?Vitals:  ? 02/02/22 0927  ?BP: 101/61  ?Pulse: 74  ?Temp: (!) 97.1 ?F (36.2 ?C)  ?Weight: 127 lb 3.2 oz (57.7 kg)  ? ? ?Fetal Status: Fetal Heart Rate (bpm): 154 Fundal Height: 16 cm Movement: Absent    ? ?General:  Alert, oriented and cooperative. Patient is in no acute distress.  ?Skin: Skin is warm and dry. No rash noted.   ?Cardiovascular: Normal heart rate noted  ?Respiratory: Normal respiratory effort, no problems with respiration noted  ?Abdomen: Soft, gravid, appropriate for gestational age.  Pain/Pressure: Present     ?Pelvic: Cervical exam deferred         ?Extremities: Normal range of motion.  Edema: None  ?Mental Status: Normal mood and affect. Normal behavior. Normal judgment and thought content.  ? ?Assessment and Plan:  ?Pregnancy: G2P0010 at [redacted]w[redacted]d ? ?1. Supervision of high risk pregnancy, antepartum ?Continue current care. Enc to keep fetal anat [place] as sched 02/15/22. ?- AFP TETRA ? ?2. Drug use affecting pregnancy, antepartum ?States "doing better" re: drug use. ?"Plan of Safe Care" info given today. ?Considering [employer] - she has heard it is a good program, this was encouraged. ?Might start court-mandated treatment next week. ? ?3. Smoker 1 ppd ?Plan to enc cessation at RV. ? ? ?Preterm labor symptoms and general obstetric precautions including but not limited to vaginal bleeding, contractions, leaking of fluid and fetal movement were reviewed in detail with the patient. ?Please refer to After Visit Summary for other counseling recommendations.  ?Return in about 4 weeks (around 03/02/2022) for Routine prenatal care. ? ?No future appointments. ? ?Lora Havens, PA-C ? ?

## 2022-02-04 LAB — AFP TETRA
DIA Mom Value: 1.03
DIA Value (EIA): 173.79 pg/mL
DSR (By Age)    1 IN: 930
DSR (Second Trimester) 1 IN: 7475
Gestational Age: 17 WEEKS
MSAFP Mom: 1.89
MSAFP: 83.2 ng/mL
MSHCG Mom: 0.93
MSHCG: 36104 m[IU]/mL
Maternal Age At EDD: 26.9 yr
Osb Risk: 1034
T18 (By Age): 1:3625 {titer}
Test Results:: NEGATIVE
Weight: 127 [lb_av]
uE3 Mom: 0.84
uE3 Value: 1.03 ng/mL

## 2022-02-16 NOTE — Addendum Note (Signed)
Addended by: Heywood Bene on: 02/16/2022 01:46 PM ? ? Modules accepted: Orders ? ?

## 2022-03-02 ENCOUNTER — Ambulatory Visit: Payer: Medicaid Other

## 2022-03-02 ENCOUNTER — Telehealth: Payer: Self-pay

## 2022-03-02 NOTE — Telephone Encounter (Signed)
Call to client who states was unable to keep maternity clinic appt this am due to car trouble. Appt rescheduled for 03/13/2022. Jossie Ng, RN

## 2022-03-13 ENCOUNTER — Ambulatory Visit: Payer: Medicaid Other

## 2022-03-21 ENCOUNTER — Encounter: Payer: Self-pay | Admitting: Obstetrics and Gynecology

## 2022-03-21 ENCOUNTER — Telehealth: Payer: Self-pay | Admitting: Family Medicine

## 2022-03-21 ENCOUNTER — Observation Stay
Admission: EM | Admit: 2022-03-21 | Discharge: 2022-03-21 | Disposition: A | Discharge status: Home / Self Care | Payer: Medicaid Other | Attending: Obstetrics | Admitting: Obstetrics

## 2022-03-21 ENCOUNTER — Observation Stay: Payer: Medicaid Other

## 2022-03-21 DIAGNOSIS — O99322 Drug use complicating pregnancy, second trimester: Secondary | ICD-10-CM | POA: Diagnosis not present

## 2022-03-21 DIAGNOSIS — O99332 Smoking (tobacco) complicating pregnancy, second trimester: Secondary | ICD-10-CM | POA: Insufficient documentation

## 2022-03-21 DIAGNOSIS — Z3A23 23 weeks gestation of pregnancy: Secondary | ICD-10-CM | POA: Insufficient documentation

## 2022-03-21 DIAGNOSIS — O23592 Infection of other part of genital tract in pregnancy, second trimester: Principal | ICD-10-CM | POA: Insufficient documentation

## 2022-03-21 DIAGNOSIS — B9689 Other specified bacterial agents as the cause of diseases classified elsewhere: Secondary | ICD-10-CM | POA: Insufficient documentation

## 2022-03-21 DIAGNOSIS — F1590 Other stimulant use, unspecified, uncomplicated: Secondary | ICD-10-CM | POA: Insufficient documentation

## 2022-03-21 DIAGNOSIS — O4192X0 Disorder of amniotic fluid and membranes, unspecified, second trimester, not applicable or unspecified: Secondary | ICD-10-CM | POA: Diagnosis present

## 2022-03-21 DIAGNOSIS — Z79899 Other long term (current) drug therapy: Secondary | ICD-10-CM | POA: Insufficient documentation

## 2022-03-21 DIAGNOSIS — F17210 Nicotine dependence, cigarettes, uncomplicated: Secondary | ICD-10-CM | POA: Insufficient documentation

## 2022-03-21 DIAGNOSIS — O4290 Premature rupture of membranes, unspecified as to length of time between rupture and onset of labor, unspecified weeks of gestation: Secondary | ICD-10-CM | POA: Diagnosis present

## 2022-03-21 LAB — URINALYSIS, ROUTINE W REFLEX MICROSCOPIC
Bilirubin Urine: NEGATIVE
Glucose, UA: NEGATIVE mg/dL
Hgb urine dipstick: NEGATIVE
Ketones, ur: NEGATIVE mg/dL
Leukocytes,Ua: NEGATIVE
Nitrite: NEGATIVE
Protein, ur: NEGATIVE mg/dL
Specific Gravity, Urine: 1.009 (ref 1.005–1.030)
pH: 5 (ref 5.0–8.0)

## 2022-03-21 LAB — URINE DRUG SCREEN, QUALITATIVE (ARMC ONLY)
Amphetamines, Ur Screen: POSITIVE — AB
Barbiturates, Ur Screen: NOT DETECTED
Benzodiazepine, Ur Scrn: NOT DETECTED
Cannabinoid 50 Ng, Ur ~~LOC~~: NOT DETECTED
Cocaine Metabolite,Ur ~~LOC~~: NOT DETECTED
MDMA (Ecstasy)Ur Screen: NOT DETECTED
Methadone Scn, Ur: NOT DETECTED
Opiate, Ur Screen: NOT DETECTED
Phencyclidine (PCP) Ur S: NOT DETECTED
Tricyclic, Ur Screen: NOT DETECTED

## 2022-03-21 LAB — WET PREP, GENITAL
Sperm: NONE SEEN
Trich, Wet Prep: NONE SEEN
WBC, Wet Prep HPF POC: <10 (ref <10)
Yeast Wet Prep HPF POC: NONE SEEN

## 2022-03-21 LAB — CHLAMYDIA/NGC RT PCR (ARMC ONLY)
Chlamydia Tr: NOT DETECTED
N gonorrhoeae: NOT DETECTED

## 2022-03-21 LAB — RUPTURE OF MEMBRANE (ROM)PLUS: Rom Plus: NEGATIVE

## 2022-03-21 MED ORDER — METRONIDAZOLE 500 MG PO TABS: 500.0000 mg | ORAL_TABLET | Freq: Two times a day (BID) | ORAL | Status: DC

## 2022-03-21 MED ORDER — METRONIDAZOLE 500 MG PO TABS
500.0000 mg | ORAL_TABLET | Freq: Two times a day (BID) | ORAL | 0 refills | Status: DC
Start: 2022-03-21 — End: 2022-05-17

## 2022-03-21 MED ORDER — METRONIDAZOLE 500 MG PO TABS
500.0000 mg | ORAL_TABLET | Freq: Two times a day (BID) | ORAL | Status: DC
  Administered 2022-03-21: 500 mg via ORAL
  Filled 2022-03-21: qty 1
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
## 2022-03-21 NOTE — Telephone Encounter (Signed)
MyChart message sent to patient today asking her to please call our office to reschedule her MH RV.  Last patient login 03-13-2022.  Dahlia Bailiff, RN

## 2022-03-24 NOTE — Telephone Encounter (Signed)
Telephone call to patient today to reschedule her MH RV.  Left message for patient to call [phone].  Hart Carwin, RN

## 2022-03-27 NOTE — Telephone Encounter (Signed)
Telephone call to patient to reschedule her missed MH RV.  Left message to please return our call at [phone].  Hart Carwin, RN

## 2022-03-28 NOTE — Telephone Encounter (Signed)
Telephone call to patient today.  Rescheduled her missed MH RV.  Appointment made for 04-03-2022 at 1:40 pm (arrival time is 1:20 pm).   Patient reports dealing with an abscess tooth and moving and has just not had time to call to schedule.   Hart Carwin, RN

## 2022-04-03 ENCOUNTER — Ambulatory Visit: Payer: Medicaid Other

## 2022-04-07 ENCOUNTER — Ambulatory Visit: Payer: Medicaid Other | Admitting: Physician Assistant

## 2022-04-07 ENCOUNTER — Encounter: Payer: Self-pay | Admitting: Physician Assistant

## 2022-04-07 VITALS — BP 109/76 | HR 87 | Temp 97.7°F | Wt 138.8 lb

## 2022-04-07 DIAGNOSIS — O0992 Supervision of high risk pregnancy, unspecified, second trimester: Secondary | ICD-10-CM

## 2022-04-07 DIAGNOSIS — F1910 Other psychoactive substance abuse, uncomplicated: Secondary | ICD-10-CM

## 2022-04-07 DIAGNOSIS — O0990 Supervision of high risk pregnancy, unspecified, unspecified trimester: Secondary | ICD-10-CM

## 2022-04-07 NOTE — Progress Notes (Addendum)
Patient here for MH RV at 26 1/7.  She states she has [facility] information sheet in color and does not need another copy. [name] contact card given. Peds list given, patient wants Nexplanon for PP BCM. Treated for BV after ED visit on 03/21/22 and states she took all the medication. Patient states she has a dental appointment on 05/03/2022. CMHRP and PHQ9 today.Marland KitchenMarland KitchenMarland KitchenBurt Knack, RN
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
## 2022-04-12 LAB — 789231 7+OXYCODONE-BUND
BENZODIAZ UR QL: NEGATIVE ng/mL
Barbiturate screen, urine: NEGATIVE ng/mL
Cannabinoid Quant, Ur: NEGATIVE ng/mL
Cocaine (Metab.): NEGATIVE ng/mL
OPIATE SCREEN URINE: NEGATIVE ng/mL
Oxycodone/Oxymorphone, Urine: NEGATIVE ng/mL
PCP Quant, Ur: NEGATIVE ng/mL

## 2022-04-12 LAB — AMPHETAMINE CONF, UR
Amphetamine GC/MS Conf: 1575 ng/mL
Amphetamine: POSITIVE — AB
Amphetamines: POSITIVE — AB
Methamphetamine Quant, Ur: >3000 ng/mL
Methamphetamine: POSITIVE — AB

## 2022-04-21 ENCOUNTER — Ambulatory Visit: Payer: Medicaid Other | Admitting: Advanced Practice Midwife

## 2022-04-21 VITALS — BP 122/65 | HR 93 | Temp 97.5°F | Wt 141.4 lb

## 2022-04-21 DIAGNOSIS — Z720 Tobacco use: Secondary | ICD-10-CM

## 2022-04-21 DIAGNOSIS — O99323 Drug use complicating pregnancy, third trimester: Secondary | ICD-10-CM

## 2022-04-21 DIAGNOSIS — O0990 Supervision of high risk pregnancy, unspecified, unspecified trimester: Secondary | ICD-10-CM

## 2022-04-21 DIAGNOSIS — F17200 Nicotine dependence, unspecified, uncomplicated: Secondary | ICD-10-CM

## 2022-04-21 DIAGNOSIS — O0993 Supervision of high risk pregnancy, unspecified, third trimester: Secondary | ICD-10-CM

## 2022-04-21 DIAGNOSIS — Z789 Other specified health status: Secondary | ICD-10-CM

## 2022-04-21 DIAGNOSIS — Z23 Encounter for immunization: Secondary | ICD-10-CM | POA: Diagnosis not present

## 2022-04-21 DIAGNOSIS — R825 Elevated urine levels of drugs, medicaments and biological substances: Secondary | ICD-10-CM

## 2022-04-21 DIAGNOSIS — O99320 Drug use complicating pregnancy, unspecified trimester: Secondary | ICD-10-CM

## 2022-04-21 DIAGNOSIS — F1090 Alcohol use, unspecified, uncomplicated: Secondary | ICD-10-CM

## 2022-04-21 DIAGNOSIS — F1910 Other psychoactive substance abuse, uncomplicated: Secondary | ICD-10-CM

## 2022-04-21 LAB — URINALYSIS
Bilirubin, UA: NEGATIVE
Glucose, UA: NEGATIVE
Ketones, UA: NEGATIVE
Leukocytes,UA: NEGATIVE
Nitrite, UA: NEGATIVE
Protein,UA: NEGATIVE
RBC, UA: NEGATIVE
Specific Gravity, UA: 1.015 (ref 1.005–1.030)
Urobilinogen, Ur: 0.2 mg/dL (ref 0.2–1.0)
pH, UA: 7 (ref 5.0–7.5)

## 2022-04-21 LAB — HEMOGLOBIN, FINGERSTICK: Hemoglobin: 11.4 g/dL (ref 11.1–15.9)

## 2022-04-21 NOTE — Progress Notes (Addendum)
Patient here for MH RV at 28 1/7. 28 week labs today, Tdap and 1 hour gtt. Patient aware of [name] U/S on 04/24/2022 at 9:00am. Patient needs to be to lab by 3:00 for 3:16 blood draw. Patient states she has had some episodes of dizziness in the past 2 weeks.Marland KitchenMarland KitchenBurt Knack, RN

## 2022-04-21 NOTE — Progress Notes (Signed)
[facility]  PRENATAL VISIT NOTE  Subjective:  Bailey Davis is a 26 y.o. G2P0010 at [redacted]w[redacted]d being seen today for ongoing prenatal care.  She is currently monitored for the following issues for this high-risk pregnancy and has Smoker 1 ppd; Trichomonas infection 04/05/20; Marijuana use onset age 39; last use 12/06/21; Vapes nicotine containing substance; Substance abuse (HCC) (cocaine and meth daily) onset meth age 36 1-2x/day; onset cocaine age 31 snort; Poor historian; Supervision of high risk pregnancy, antepartum; Drug use affecting pregnancy, antepartum; Alcohol use; History of miscarriage; Urinary tract infection in mother during pregnancy, 12/06/21; Positive urine drug screen: 12/06/21 +meth,+amphetamine; 02/29/20 +cocaine, +amphetamine; 12/03/18 +amphetamine; +UDS 01/03/22 meth+amphetamine; +UDS 04/07/22 meth, amphetamine; and Leakage of amniotic fluid on their problem list.  Patient reports  occassional dizziness x2 without LOC .  Contractions: Not present. Vag. Bleeding: None.  Movement: Present. Denies leaking of fluid/ROM.   The following portions of the patient's history were reviewed and updated as appropriate: allergies, current medications, past family history, past medical history, past social history, past surgical history and problem list. Problem list updated.  Objective:   Vitals:   04/21/22 1407  BP: 122/65  Pulse: 93  Temp: (!) 97.5 F (36.4 C)  Weight: 141 lb 6.4 oz (64.1 kg)    Fetal Status: Fetal Heart Rate (bpm): 140 Fundal Height: 27 cm Movement: Present     General:  Alert, oriented and cooperative. Patient is in no acute distress.  Skin: Skin is warm and dry. No rash noted.   Cardiovascular: Normal heart rate noted  Respiratory: Normal respiratory effort, no problems with respiration noted  Abdomen: Soft, gravid, appropriate for gestational age.  Pain/Pressure: Absent     Pelvic: Cervical exam deferred         Extremities: Normal range of motion.  Edema: None  Mental Status: Normal mood and affect. Normal behavior. Normal judgment and thought content.   Assessment and Plan:  Pregnancy: G2P0010 at [redacted]w[redacted]d  1. Supervision of high risk pregnancy, antepartum 1 hour glucola today 23 lb 6.4 oz (10.6 kg) Has u/s 04/24/22 Not working; living between her Dad and FOB Here with young girl  - HIV-1/HIV-2 Qualitative RNA - Glucose tolerance, 1 hour - RPR - Hemoglobin, fingerstick - Urinalysis (Urine Dip) - 268341 Drug Screen  2. Vapes nicotine containing substance Pt denies  3. Smoker 1 ppd Smoking 1/2 ppd  4. Drug use affecting pregnancy, antepartum Pt states last MJ 2022, last meth early July 2023, last cocaine 2020, last ETOH 12/06/2021 Court date 06/08/22 Pt has enrolled in court mandated group counseling sessions and had an intake at [facility] in [place] with Kennith Maes for 8 group counseling sessions that she has to pay for  5. Alcohol use States last use 12/06/21  6. Positive urine drug screen: 12/06/21 +meth,+amphetamine; 02/29/20 +cocaine, +amphetamine; 12/03/18 +amphetamine; +UDS 01/03/22 meth+amphetamine; +UDS 04/07/22 meth, amphetamine Agrees to UDS   7. Substance abuse (HCC) (cocaine and meth daily) onset meth age 20 1-2x/day; onset cocaine age 52 snort Pt states last cocaine 2020 and last meth early July   Preterm labor symptoms and general obstetric precautions including but not limited to vaginal bleeding, contractions, leaking of fluid and fetal movement were reviewed in detail with the patient. Please refer to After Visit Summary for other counseling recommendations.  Return in about 2 weeks (around 05/05/2022) for routine PNC.  Future Appointments  Date Time Provider Department Center  05/05/2022 11:00 AM [facility] PROVIDER [facility]  None    Alberteen Spindle, CNM

## 2022-04-21 NOTE — Progress Notes (Signed)
Client counseled on recommendation for Tdap and for those people who will spend any amt of time around infant. Client tolerated Tdap today without complaint. [facility] record mailed to client. Hgb and urine dip reviewed - no intervention needed per standing order. Jossie Ng, RN

## 2022-04-23 LAB — HIV-1/HIV-2 QUALITATIVE RNA
HIV-1 RNA, Qualitative: NONREACTIVE
HIV-2 RNA, Qualitative: NONREACTIVE

## 2022-04-23 LAB — GLUCOSE TOLERANCE, 1 HOUR: Glucose, 1Hr PP: 62 mg/dL — ABNORMAL LOW (ref 70–199)

## 2022-04-23 LAB — RPR: RPR Ser Ql: NONREACTIVE

## 2022-05-05 ENCOUNTER — Ambulatory Visit: Payer: Medicaid Other

## 2022-05-08 NOTE — Progress Notes (Signed)
Received message from [facility] requesting follow-up on urine drug screen with no results from 04/21/2022. TC to [facility] and per [facility] customer service they did not receive an order for urine drug screen on that date. Per patient chart, drug screen was ordered. No results because test wasn't run. Provider aware of issue and wants patient to be tested at next visit. Note added to pink sticky note to collect urine drug screen at next appointment on 05/10/2022.Marland KitchenBurt Knack, RN

## 2022-05-10 ENCOUNTER — Ambulatory Visit: Payer: Medicaid Other

## 2022-05-10 ENCOUNTER — Telehealth: Payer: Self-pay

## 2022-05-10 NOTE — Telephone Encounter (Signed)
[facility] for MHC RV 05/10/22. Call to client to reschedule appt and per recorded message, voicemail box is full. Call to emergency contact (grandmother) and requested her assistance in contacting client to reschedule appt and she agreed to do so. Number to call provided. Jossie Ng, RN

## 2022-05-10 NOTE — Addendum Note (Signed)
Addended by: Arnetha Courser on: 05/10/2022 01:24 PM   Modules accepted: Orders

## 2022-05-11 NOTE — Telephone Encounter (Signed)
Telephone call to patient today to reschedule her MH RV.  Mailbox is full.  Hart Carwin, RN

## 2022-05-12 NOTE — Telephone Encounter (Signed)
Per Epic, patient has MH RV 05-19-22 at 2 pm.  Hart Carwin, RN

## 2022-05-17 ENCOUNTER — Encounter: Payer: Self-pay | Admitting: Obstetrics

## 2022-05-17 ENCOUNTER — Other Ambulatory Visit: Payer: Self-pay

## 2022-05-17 ENCOUNTER — Observation Stay
Admission: EM | Admit: 2022-05-17 | Discharge: 2022-05-17 | Disposition: A | Discharge status: Home / Self Care | Payer: Medicaid Other | Attending: Obstetrics and Gynecology | Admitting: Obstetrics and Gynecology

## 2022-05-17 DIAGNOSIS — M533 Sacrococcygeal disorders, not elsewhere classified: Secondary | ICD-10-CM | POA: Diagnosis not present

## 2022-05-17 DIAGNOSIS — Z3A31 31 weeks gestation of pregnancy: Secondary | ICD-10-CM | POA: Insufficient documentation

## 2022-05-17 DIAGNOSIS — Z79899 Other long term (current) drug therapy: Secondary | ICD-10-CM | POA: Diagnosis not present

## 2022-05-17 DIAGNOSIS — R1030 Lower abdominal pain, unspecified: Secondary | ICD-10-CM | POA: Insufficient documentation

## 2022-05-17 DIAGNOSIS — O99891 Other specified diseases and conditions complicating pregnancy: Secondary | ICD-10-CM | POA: Diagnosis not present

## 2022-05-17 DIAGNOSIS — O99333 Smoking (tobacco) complicating pregnancy, third trimester: Secondary | ICD-10-CM | POA: Diagnosis not present

## 2022-05-17 DIAGNOSIS — M545 Low back pain, unspecified: Secondary | ICD-10-CM | POA: Diagnosis not present

## 2022-05-17 DIAGNOSIS — F17210 Nicotine dependence, cigarettes, uncomplicated: Secondary | ICD-10-CM | POA: Insufficient documentation

## 2022-05-17 DIAGNOSIS — O2340 Unspecified infection of urinary tract in pregnancy, unspecified trimester: Secondary | ICD-10-CM

## 2022-05-17 DIAGNOSIS — O26893 Other specified pregnancy related conditions, third trimester: Secondary | ICD-10-CM | POA: Diagnosis present

## 2022-05-17 LAB — WET PREP, GENITAL
Clue Cells Wet Prep HPF POC: NONE SEEN
Sperm: NONE SEEN
Trich, Wet Prep: NONE SEEN
WBC, Wet Prep HPF POC: <10 (ref <10)
Yeast Wet Prep HPF POC: NONE SEEN

## 2022-05-17 LAB — URINE DRUG SCREEN, QUALITATIVE (ARMC ONLY)
Amphetamines, Ur Screen: POSITIVE — AB
Barbiturates, Ur Screen: NOT DETECTED
Benzodiazepine, Ur Scrn: NOT DETECTED
Cannabinoid 50 Ng, Ur ~~LOC~~: NOT DETECTED
Cocaine Metabolite,Ur ~~LOC~~: NOT DETECTED
MDMA (Ecstasy)Ur Screen: NOT DETECTED
Methadone Scn, Ur: NOT DETECTED
Opiate, Ur Screen: NOT DETECTED
Phencyclidine (PCP) Ur S: NOT DETECTED
Tricyclic, Ur Screen: NOT DETECTED

## 2022-05-17 LAB — URINALYSIS, COMPLETE (UACMP) WITH MICROSCOPIC
Bacteria, UA: NONE SEEN
Bilirubin Urine: NEGATIVE
Glucose, UA: NEGATIVE mg/dL
Hgb urine dipstick: NEGATIVE
Ketones, ur: NEGATIVE mg/dL
Leukocytes,Ua: NEGATIVE
Nitrite: NEGATIVE
Protein, ur: NEGATIVE mg/dL
Specific Gravity, Urine: 1.009 (ref 1.005–1.030)
pH: 6 (ref 5.0–8.0)

## 2022-05-17 NOTE — Discharge Summary (Signed)
Bailey Davis is a 26 y.o. female. She is at 33w6dgestation. Patient's last menstrual period was 07/19/2021 (within days). Estimated Date of Delivery: 07/13/22  Prenatal care site: [facility]  Chief complaint: back pain and lower abdominal pain   HPI: MSaronpresents to L&D with complaints of lower back pain and abdominal pain.  Pain is mostly on right lower back.  She also has lower abdominal pain that shoots towards the suprapubic area.  Denies contractions, LOF, or vaginal bleeding. Denies changes in vaginal discharge. Endorses good fetal movement.   Factors complicating pregnancy: Substance use disorder - history of meth, cocaine, and THC use  Nicotine use in pregnancy - denies current use   S: Resting in bed, no CTX, no VB.no LOF,  Active fetal movement.   Maternal Medical History:  Past Medical Hx:  has a past medical history of Easy bruising and Substance use.    Past Surgical Hx:  has a past surgical history that includes Denies surgical history and No past surgeries.   Allergies  Allergen Reactions   Other     Cilantro causes hives    Vicodin [Hydrocodone-Acetaminophen] Hives     Prior to Admission medications   Medication Sig Start Date End Date Taking? Authorizing Provider  Prenatal Vit-Fe Fumarate-FA (PRENATAL VITAMIN) 27-0.8 MG TABS Take 1 tablet by mouth daily at 6 (six) AM. 11/17/21  Yes NCaren Macadam MD  metroNIDAZOLE (FLAGYL) 500 MG tablet Take 1 tablet (500 mg total) by mouth every 12 (twelve) hours. Patient not taking: Reported on 04/07/2022 63/22/02  DEd Blalock CNM    Social History: She  reports that she has been smoking cigarettes. She has a 3.00 pack-year smoking history. She has been exposed to tobacco smoke. She has never used smokeless tobacco. She reports that she does not currently use alcohol after a past usage of about 1.0 standard drink of alcohol per week. She reports current drug use. Drugs: Methamphetamines, Cocaine, and  Marijuana.  Family History: family history includes ALS in her paternal grandfather; COPD in her paternal grandmother; Cancer in her paternal great-grandfather; Heart disease in her paternal grandmother; Hypertension in her father, mother, and paternal grandmother; Migraines in her father; Pancreatic disease in her paternal grandmother; Seizures in her paternal grandmother.   Review of Systems: A full review of systems was performed and negative except as noted in the HPI.     Pertinent Results:  Prenatal Labs: Blood type/Rh B/Positive/-- (02/28 1610)   Antibody screen Negative    Rubella MMR x 2    Varicella Varivax x 2  RPR Non Reactive (07/14 1519)   HBsAg Negative (02/28 1610)  Hep C NR   HIV NR  GC neg  Chlamydia neg  Genetic screening AFP neg  1 hour GTT 62  3 hour GTT N/A  GBS Unknown     O:  BP 122/86   Pulse 83   Ht 5' 2"  (1.575 Bailey)   Wt 64 kg   LMP 07/19/2021 (Within Days) Comment: Reports 2 days of "vaginal goo" 08/2021, but not really a period.  BMI 25.79 kg/Bailey  Results for orders placed or performed during the hospital encounter of 05/17/22 (from the past 48 hour(s))  Urinalysis, Complete w Microscopic Urine, Clean Catch   Collection Time: 05/17/22  2:43 PM  Result Value Ref Range   Color, Urine STRAW (A) YELLOW   APPearance CLEAR (A) CLEAR   Specific Gravity, Urine 1.009 1.005 - 1.030   pH 6.0  5.0 - 8.0   Glucose, UA NEGATIVE NEGATIVE mg/dL   Hgb urine dipstick NEGATIVE NEGATIVE   Bilirubin Urine NEGATIVE NEGATIVE   Ketones, ur NEGATIVE NEGATIVE mg/dL   Protein, ur NEGATIVE NEGATIVE mg/dL   Nitrite NEGATIVE NEGATIVE   Leukocytes,Ua NEGATIVE NEGATIVE   RBC / HPF 0-5 0 - 5 RBC/hpf   WBC, UA 0-5 0 - 5 WBC/hpf   Bacteria, UA NONE SEEN NONE SEEN   Squamous Epithelial / LPF 0-5 0 - 5   Mucus PRESENT   Urine Drug Screen, Qualitative ([facility] only)   Collection Time: 05/17/22  2:43 PM  Result Value Ref Range   Tricyclic, Ur Screen NONE DETECTED NONE DETECTED    Amphetamines, Ur Screen POSITIVE (A) NONE DETECTED   MDMA (Ecstasy)Ur Screen NONE DETECTED NONE DETECTED   Cocaine Metabolite,Ur [place] NONE DETECTED NONE DETECTED   Opiate, Ur Screen NONE DETECTED NONE DETECTED   Phencyclidine (PCP) Ur S NONE DETECTED NONE DETECTED   Cannabinoid 50 Ng, Ur Bailey Davis NONE DETECTED NONE DETECTED   Barbiturates, Ur Screen NONE DETECTED NONE DETECTED   Benzodiazepine, Ur Scrn NONE DETECTED NONE DETECTED   Methadone Scn, Ur NONE DETECTED NONE DETECTED  Wet prep, genital   Collection Time: 05/17/22  2:55 PM  Result Value Ref Range   Yeast Wet Prep HPF POC NONE SEEN NONE SEEN   Trich, Wet Prep NONE SEEN NONE SEEN   Clue Cells Wet Prep HPF POC NONE SEEN NONE SEEN   WBC, Wet Prep HPF POC <10 <10   Sperm NONE SEEN      Constitutional: NAD, AAOx3  HE/ENT: extraocular movements grossly intact, moist mucous membranes CV: RRR PULM: nl respiratory effort Abd: gravid, non-tender, non-distended, soft  Ext: Non-tender, Nonedmeatous Psych: mood appropriate, speech normal Pelvic : deferred  Fetal Monitor: Baseline: 135 bpm Variability: moderate Accels: Present Decels: none Toco: none  Category: I NST: Reactive    Assessment: 26 y.o. 49w6dhere for antenatal surveillance during pregnancy.  Principle diagnosis: Back pain affecting pregnancy in third trimester, SI Joint dysfunction   Plan: Labor: not present.  NST reviewed - reactive tracing  Fetal Wellbeing: Reassuring Cat 1 tracing. Reviewed comfort measures for SI joint dysfunction.  May take Tylenol, heat, ice, positioning, and stretches.  Discussed that PT may be recommended if she does not notice a significant improvement.  She can discuss this with her prenatal provider at her appointment this week.  Discussed UDS - positive for amphetamines.  MKamayahstates that she has not taken any thing that would have amphetamines in it recently (within the last couple of months).  I reviewed that her UDS has been  positive for amphetamines, discussed risks of amphetamines in pregnancy, and strongly encouraged her to explore possible sources and discontinue them.   D/c home stable, precautions reviewed, follow-up as scheduled.   ----- ADrinda Davis CNM Certified Nurse Midwife [facility]

## 2022-05-19 ENCOUNTER — Ambulatory Visit: Payer: Medicaid Other | Admitting: Advanced Practice Midwife

## 2022-05-19 VITALS — BP 128/81 | Temp 96.9°F | Wt 148.2 lb

## 2022-05-19 DIAGNOSIS — O0990 Supervision of high risk pregnancy, unspecified, unspecified trimester: Secondary | ICD-10-CM

## 2022-05-19 DIAGNOSIS — F1090 Alcohol use, unspecified, uncomplicated: Secondary | ICD-10-CM

## 2022-05-19 DIAGNOSIS — R825 Elevated urine levels of drugs, medicaments and biological substances: Secondary | ICD-10-CM

## 2022-05-19 DIAGNOSIS — F1910 Other psychoactive substance abuse, uncomplicated: Secondary | ICD-10-CM

## 2022-05-19 DIAGNOSIS — F17200 Nicotine dependence, unspecified, uncomplicated: Secondary | ICD-10-CM

## 2022-05-19 DIAGNOSIS — Z789 Other specified health status: Secondary | ICD-10-CM

## 2022-05-19 DIAGNOSIS — Z720 Tobacco use: Secondary | ICD-10-CM

## 2022-05-19 LAB — URINALYSIS
Bilirubin, UA: NEGATIVE
Glucose, UA: NEGATIVE
Ketones, UA: NEGATIVE
Leukocytes,UA: NEGATIVE
Nitrite, UA: NEGATIVE
Protein,UA: NEGATIVE
RBC, UA: NEGATIVE
Specific Gravity, UA: 1.015 (ref 1.005–1.030)
Urobilinogen, Ur: 0.2 mg/dL (ref 0.2–1.0)
pH, UA: 6 (ref 5.0–7.5)

## 2022-05-19 NOTE — Progress Notes (Signed)
[facility]  PRENATAL VISIT NOTE  Subjective:  Bailey Davis is a 26 y.o. G3P0020 at [redacted]w[redacted]d being seen today for ongoing prenatal care.  She is currently monitored for the following issues for this high-risk pregnancy and has Marijuana use onset age 32; last use 12/06/21; Vapes nicotine containing substance; Substance abuse (HCC) (cocaine and meth daily) onset meth age 34 1-2x/day; onset cocaine age 29 snort; Poor historian; Supervision of high risk pregnancy, antepartum; Drug use affecting pregnancy, antepartum; Alcohol use; History of miscarriage; Urinary tract infection in mother during pregnancy, 12/06/21; Positive urine drug screen: 12/06/21 +meth,+amphetamine; 02/29/20 +cocaine, +amphetamine; 12/03/18 +amphetamine; +UDS 01/03/22 meth+amphetamine; +UDS 04/07/22 meth, amphetamine; and Back pain affecting pregnancy in third trimester on their problem list.  Patient reports no complaints.  Contractions: Not present. Vag. Bleeding: None.  Movement: Present. Denies leaking of fluid/ROM.   The following portions of the patient's history were reviewed and updated as appropriate: allergies, current medications, past family history, past medical history, past social history, past surgical history and problem list. Problem list updated.  Objective:   Vitals:   05/19/22 1428  BP: 128/81  Temp: (!) 96.9 F (36.1 C)  Weight: 148 lb 3.2 oz (67.2 kg)    Fetal Status: Fetal Heart Rate (bpm): 130 Fundal Height: 29 cm Movement: Present     General:  Alert, oriented and cooperative. Patient is in no acute distress.  Skin: Skin is warm and dry. No rash noted.   Cardiovascular: Normal heart rate noted  Respiratory: Normal respiratory effort, no problems with respiration noted  Abdomen: Soft, gravid, appropriate for gestational age.  Pain/Pressure: Absent     Pelvic: Cervical exam deferred        Extremities: Normal range of motion.  Edema: None  Mental Status:  Normal mood and affect. Normal behavior. Normal judgment and thought content.   Assessment and Plan:  Pregnancy: G3P0020 at [redacted]w[redacted]d  1. Supervision of high risk pregnancy, antepartum Smoking 3/4 ppd Denies vaping Not working Living with her Dad and FOB 1 hour glucola=62 Went to [facility] ER 05/17/22 for right low back pain and abdominal pain, reactive NST, dx: si joint pain U/s 05/29/22 Reviewed 04/24/22 u/s at 28 6/7 with anterior placenta, 3VC, AFI wnl, EFW=53% - Urinalysis (Urine Dip) - 154008 Drug Screen  2. Positive urine drug screen: 12/06/21 +meth,+amphetamine; 02/29/20 +cocaine, +amphetamine; 12/03/18 +amphetamine; +UDS 01/03/22 meth+amphetamine; +UDS 04/07/22 meth, amphetamine +UDS 04/07/22 and 05/17/22 at [facility] ER for amphetamines Agrees to UDS today Was using meth 1-2x/day and last use per pt 04/2022 Has to complete 8 therapy sessions before court date on 06/08/22 and has completed 4; next session this afternoon Last cocaine 2020  3. Alcohol use Denies use  4. Substance abuse (HCC) (cocaine and meth daily) onset meth age 26 1-2x/day; onset cocaine age 40 snort   5. Vapes nicotine containing substance Denies use   Preterm labor symptoms and general obstetric precautions including but not limited to vaginal bleeding, contractions, leaking of fluid and fetal movement were reviewed in detail with the patient. Please refer to After Visit Summary for other counseling recommendations.  Return in about 2 weeks (around 06/02/2022) for routine PNC.  No future appointments.  Alberteen Spindle, CNM

## 2022-05-19 NOTE — Progress Notes (Signed)
Client kept 04/24/2022 [name] [place] appt and aware of follow-up appt 05/29/2022. Verified client has [name] contact card. Client states still has previously given Pediatrician Resource List and she will be selecting pediatrician soon. Jossie Ng, RN

## 2022-05-25 LAB — AMPHETAMINE CONF, UR
Amphetamine GC/MS Conf: 1349 ng/mL
Amphetamine: POSITIVE — AB
Amphetamines: POSITIVE — AB
Methamphetamine Quant, Ur: >3000 ng/mL
Methamphetamine: POSITIVE — AB

## 2022-05-25 LAB — 789231 7+OXYCODONE-BUND
BENZODIAZ UR QL: NEGATIVE ng/mL
Barbiturate screen, urine: NEGATIVE ng/mL
Cannabinoid Quant, Ur: NEGATIVE ng/mL
Cocaine (Metab.): NEGATIVE ng/mL
OPIATE SCREEN URINE: NEGATIVE ng/mL
Oxycodone/Oxymorphone, Urine: NEGATIVE ng/mL
PCP Quant, Ur: NEGATIVE ng/mL

## 2022-06-02 ENCOUNTER — Ambulatory Visit: Payer: Medicaid Other | Admitting: Advanced Practice Midwife

## 2022-06-02 VITALS — BP 131/87 | HR 93 | Temp 97.0°F | Wt 151.6 lb

## 2022-06-02 DIAGNOSIS — O0990 Supervision of high risk pregnancy, unspecified, unspecified trimester: Secondary | ICD-10-CM

## 2022-06-02 DIAGNOSIS — Z720 Tobacco use: Secondary | ICD-10-CM

## 2022-06-02 DIAGNOSIS — F1910 Other psychoactive substance abuse, uncomplicated: Secondary | ICD-10-CM

## 2022-06-02 DIAGNOSIS — F17200 Nicotine dependence, unspecified, uncomplicated: Secondary | ICD-10-CM

## 2022-06-02 DIAGNOSIS — R825 Elevated urine levels of drugs, medicaments and biological substances: Secondary | ICD-10-CM

## 2022-06-02 NOTE — Progress Notes (Signed)
[facility] M[facility]  PRENATAL VISIT NOTE  Subjective:  Bailey Davis is a 26 y.o. G3P0020 at [redacted]w[redacted]d being seen today for ongoing prenatal care.  She is currently monitored for the following issues for this high-risk pregnancy and has Marijuana use onset age 30; last use 12/06/21; Vapes nicotine containing substance; Substance abuse (HCC) (cocaine and meth daily) onset meth age 40 1-2x/day; onset cocaine age 10 snort; Poor historian; Supervision of high risk pregnancy, antepartum; Drug use affecting pregnancy, antepartum; Alcohol use; History of miscarriage; Urinary tract infection in mother during pregnancy, 12/06/21; Positive urine drug screen: 12/06/21 +meth,+amphetamine; 02/29/20 +cocaine, +amphetamine; 12/03/18 +amphetamine; +UDS 01/03/22 meth+amphetamine; +UDS 04/07/22 meth, amphetamine; Back pain affecting pregnancy in third trimester; and Smoker 3/4-1 ppd on their problem list.  Patient reports no complaints.  Contractions: Not present. Vag. Bleeding: None.  Movement: Present. Denies leaking of fluid/ROM.   The following portions of the patient's history were reviewed and updated as appropriate: allergies, current medications, past family history, past medical history, past social history, past surgical history and problem list. Problem list updated.  Objective:   Vitals:   06/02/22 1431  BP: 131/87  Pulse: 93  Temp: (!) 97 F (36.1 C)  Weight: 151 lb 9.6 oz (68.8 kg)    Fetal Status: Fetal Heart Rate (bpm): 128 Fundal Height: 34 cm Movement: Present     General:  Alert, oriented and cooperative. Patient is in no acute distress.  Skin: Skin is warm and dry. No rash noted.   Cardiovascular: Normal heart rate noted  Respiratory: Normal respiratory effort, no problems with respiration noted  Abdomen: Soft, gravid, appropriate for gestational age.  Pain/Pressure: Absent     Pelvic: Cervical exam deferred        Extremities: Normal range of motion.   Edema: None  Mental Status: Normal mood and affect. Normal behavior. Normal judgment and thought content.   Assessment and Plan:  Pregnancy: G3P0020 at [redacted]w[redacted]d  1. Supervision of high risk pregnancy, antepartum 33 lb 9.6 oz (15.2 kg) Not exercising Not working Here with grandmother Last ETOH 11/2021 Reviewed 05/29/22 u/s at 32 6/7 wks with AFI wnl, BPP 8/8, growth wnl, anterior placenta Has car seat, 3 cribs  2. Positive urine drug screen: 12/06/21 +meth,+amphetamine; 02/29/20 +cocaine, +amphetamine; 12/03/18 +amphetamine; +UDS 01/03/22 meth+amphetamine; +UDS 04/07/22 meth, amphetamine States last meth couple months ago +UDS meth at last apt 05/19/22 Plan of safe care given 02/02/22  3. Vapes nicotine containing substance Denies use  4. Substance abuse (HCC) (cocaine and meth daily) onset meth age 81 1-2x/day; onset cocaine age 33 snort Finishing 8th therapy session tonight and has mandated court date 06/08/22 and has to pay fine of $184  5. Smoker 3/4-1 ppd States now smoking 4-6 cpd because has no money   Preterm labor symptoms and general obstetric precautions including but not limited to vaginal bleeding, contractions, leaking of fluid and fetal movement were reviewed in detail with the patient. Please refer to After Visit Summary for other counseling recommendations.  Return in about 2 weeks (around 06/16/2022) for routine PNC.  No future appointments.  Alberteen Spindle, CNM

## 2022-06-02 NOTE — Progress Notes (Signed)
Patient here for MH RV at 34 1/7. Had [facility] U/S on 05/29/2022.Marland KitchenBurt Knack, RN

## 2022-06-16 ENCOUNTER — Ambulatory Visit: Payer: Medicaid Other

## 2022-06-26 ENCOUNTER — Encounter: Payer: Self-pay | Admitting: Family Medicine

## 2022-06-26 NOTE — Progress Notes (Unsigned)
Patient ID: Bailey Davis, female   DOB: 10-Apr-1996, 26 y.o.   MRN: [id number] [place] Conference Date: 06/26/22  Dejanay Wamboldt was identified by clinical staff to benefit from an interdisciplinary team approach to help improve pregnancy care.  The Central includes the maternity clinic coordinator (RN), medical providers (MD/APP staff), Care Management -OBCM and [facility], Centering Pregnancy coordinator, Infant Mortality reduction Geophysical data processor.  Nursing staff are also encouraged to participate. The group meets monthly to discuss patient care and coordinate services.   The patient's care care at the agency was reviewed in EMR and high risk factors evaluated in an interdisciplinary approach.    Value added interventions discussed at this care conference today were:  [facility] Meeting 06/22/22 Pt. Has substance abuse issues  Working with RT case management Case manager RT has talked to patient about safe plan of care DSS will follow-up with pt. at hospital and pt. is aware PP consult at hospital  Case manager RT talked to pt. Regarding paternity test  L.Terisa Starr

## 2022-06-29 ENCOUNTER — Encounter: Payer: Self-pay | Admitting: Physician Assistant

## 2022-06-29 ENCOUNTER — Ambulatory Visit: Payer: Medicaid Other | Admitting: Physician Assistant

## 2022-06-29 VITALS — BP 116/73 | HR 74 | Temp 97.3°F | Wt 159.2 lb

## 2022-06-29 DIAGNOSIS — O0993 Supervision of high risk pregnancy, unspecified, third trimester: Secondary | ICD-10-CM

## 2022-06-29 DIAGNOSIS — O99320 Drug use complicating pregnancy, unspecified trimester: Secondary | ICD-10-CM

## 2022-06-29 DIAGNOSIS — O0990 Supervision of high risk pregnancy, unspecified, unspecified trimester: Secondary | ICD-10-CM

## 2022-06-29 DIAGNOSIS — O99323 Drug use complicating pregnancy, third trimester: Secondary | ICD-10-CM

## 2022-06-29 LAB — URINALYSIS
Bilirubin, UA: NEGATIVE
Glucose, UA: NEGATIVE
Ketones, UA: NEGATIVE
Nitrite, UA: NEGATIVE
Protein,UA: NEGATIVE
Specific Gravity, UA: 1.01 (ref 1.005–1.030)
Urobilinogen, Ur: 0.2 mg/dL (ref 0.2–1.0)
pH, UA: 7 (ref 5.0–7.5)

## 2022-06-29 NOTE — Progress Notes (Signed)
[place] [facility]  PRENATAL VISIT NOTE  Subjective:  Chelan Heringer is a 26 y.o. G3P0020 at [redacted]w[redacted]d being seen today for ongoing prenatal care.  She is currently monitored for the following issues for this high-risk pregnancy and has Marijuana use onset age 12; last use 12/06/21; Vapes nicotine containing substance; Substance abuse ([place]) (cocaine and meth daily) onset meth age 47 1-2x/day; onset cocaine age 67 snort; Poor historian; Supervision of high risk pregnancy, antepartum; Drug use affecting pregnancy, antepartum; Alcohol use; History of miscarriage; Urinary tract infection in mother during pregnancy, 12/06/21; Positive urine drug screen: 12/06/21 +meth,+amphetamine; 02/29/20 +cocaine, +amphetamine; 12/03/18 +amphetamine; +UDS 01/03/22 meth+amphetamine; +UDS 04/07/22 meth, amphetamine; Back pain affecting pregnancy in third trimester; and Smoker 3/4-1 ppd on their problem list.  Patient reports  occ pelvic pressure .  Contractions: Not present. Vag. Bleeding: None.  Movement: Present. Denies leaking of fluid/ROM.   The following portions of the patient's history were reviewed and updated as appropriate: allergies, current medications, past family history, past medical history, past social history, past surgical history and problem list. Problem list updated.  Objective:   Vitals:   06/29/22 0828  BP: 136/85  Pulse: 78  Temp: (!) 97.3 F (36.3 C)  Weight: 159 lb 3.2 oz (72.2 kg)    Fetal Status: Fetal Heart Rate (bpm): 128 Fundal Height: 36 cm Movement: Present  Presentation: Vertex  General:  Alert, oriented and cooperative. Patient is in no acute distress.  Skin: Skin is warm and dry. No rash noted.   Cardiovascular: Normal heart rate noted  Respiratory: Normal respiratory effort, no problems with respiration noted  Abdomen: Soft, gravid, appropriate for gestational age.  Pain/Pressure: Absent     Pelvic: Cervical exam performed Dilation: 2  Effacement (%): 50 Station: -3  Extremities: Normal range of motion.  Edema: None  Mental Status: Normal mood and affect. Normal behavior. Normal judgment and thought content.   Assessment and Plan:  Pregnancy: G3P0020 at [redacted]w[redacted]d  1. Supervision of high risk pregnancy, antepartum PHQ-9 score = 5. Routine rectovaginal testing today.  Cervix check at pt request and in light of pelvic pressure. BP sl elevated at initial assessment, CC urine with no protein and BP recheck 116/73. Enc po hydration and monitor sx for labor. - Culture, beta strep (group b only) - Chlamydia/GC NAA, Confirmation  2. Drug use affecting pregnancy, antepartum Pt states she completed substance abuse program on 06/07/22. Agrees to UDS today. "Plan of Safe Care" reviewed with pt by Kieth Brightly. - [id number] 7+Oxycodone-Bund   Term labor symptoms and general obstetric precautions including but not limited to vaginal bleeding, contractions, leaking of fluid and fetal movement were reviewed in detail with the patient. Please refer to After Visit Summary for other counseling recommendations.  Return in about 1 week (around 07/06/2022) for Routine prenatal care.  No future appointments.  Lora Havens, PA-C

## 2022-06-29 NOTE — Progress Notes (Addendum)
Patient here for MH RV at 38w 0d.   Urine dip reviewed with provider during clinic visit. No treatment indicated.   Al Decant, RN

## 2022-07-03 LAB — CHLAMYDIA/GC NAA, CONFIRMATION
Chlamydia trachomatis, NAA: NEGATIVE
Neisseria gonorrhoeae, NAA: NEGATIVE

## 2022-07-03 LAB — CULTURE, BETA STREP (GROUP B ONLY): Strep Gp B Culture: NEGATIVE

## 2022-07-04 LAB — 789231 7+OXYCODONE-BUND
BENZODIAZ UR QL: NEGATIVE ng/mL
Barbiturate screen, urine: NEGATIVE ng/mL
Cannabinoid Quant, Ur: NEGATIVE ng/mL
Cocaine (Metab.): NEGATIVE ng/mL
OPIATE SCREEN URINE: NEGATIVE ng/mL
Oxycodone/Oxymorphone, Urine: NEGATIVE ng/mL
PCP Quant, Ur: NEGATIVE ng/mL

## 2022-07-04 LAB — AMPHETAMINE CONF, UR: Amphetamines: NEGATIVE

## 2022-07-06 ENCOUNTER — Ambulatory Visit: Payer: Medicaid Other | Admitting: Physician Assistant

## 2022-07-06 ENCOUNTER — Encounter: Payer: Self-pay | Admitting: Physician Assistant

## 2022-07-06 ENCOUNTER — Telehealth: Payer: Self-pay

## 2022-07-06 VITALS — BP 140/90 | Temp 97.1°F | Wt 158.6 lb

## 2022-07-06 DIAGNOSIS — O0990 Supervision of high risk pregnancy, unspecified, unspecified trimester: Secondary | ICD-10-CM

## 2022-07-06 DIAGNOSIS — O99320 Drug use complicating pregnancy, unspecified trimester: Secondary | ICD-10-CM

## 2022-07-06 LAB — URINALYSIS
Bilirubin, UA: NEGATIVE
Glucose, UA: NEGATIVE
Ketones, UA: NEGATIVE
Leukocytes,UA: NEGATIVE
Nitrite, UA: NEGATIVE
Protein,UA: NEGATIVE
RBC, UA: NEGATIVE
Specific Gravity, UA: 1.01 (ref 1.005–1.030)
Urobilinogen, Ur: 0.2 mg/dL (ref 0.2–1.0)
pH, UA: 6.5 (ref 5.0–7.5)

## 2022-07-06 NOTE — Telephone Encounter (Signed)
Call to [name], [place] scheduler and left message with requested information regarding [facility] appt (referral faxed). Number to call provider. [name], RN

## 2022-07-06 NOTE — Progress Notes (Signed)
[facility]  PRENATAL VISIT NOTE  Subjective:  Bailey Davis is a 26 y.o. G3P0020 at [redacted]w[redacted]d being seen today for ongoing prenatal care.  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  Patient reports occasional contractions.  Contractions: Not present. Vag. Bleeding: None.  Movement: Present. Contractions are mild, infrequent, irreg. Denies leaking of fluid/ROM.   The following portions of the patient's history were reviewed and updated as appropriate: allergies, current medications, past family history, past medical history, past social history, past surgical history and problem list. Problem list updated.  Objective:   Vitals:   07/06/22 1407  BP: (!) 140/90  Temp: (!) 97.1 F (36.2 C)  Weight: 158 lb 9.6 oz (71.9 kg)    Fetal Status: Fetal Heart Rate (bpm): 132 Fundal Height: 38 cm Movement: Present  Presentation: Vertex  General:  Alert, oriented and cooperative. Patient is in no acute distress.  Skin: Skin is warm and dry. No rash noted.   Cardiovascular: Normal heart rate noted  Respiratory: Normal respiratory effort, no problems with respiration noted  Abdomen: Soft, gravid, appropriate for gestational age.  Pain/Pressure: Absent     Pelvic:  Cervical exam performed Dilation: 2 Effacement (%): 60 Station: -3  Extremities: Normal range of motion.  Edema: None  Mental Status: Normal mood and affect. Normal behavior. Normal judgment and thought content.   Assessment and Plan:  Pregnancy: G3P0020 at [redacted]w[redacted]d  1. Supervision of high risk pregnancy, antepartum Discussed option/risks/benefits of IOL at 41-42 wk. Cervix check done, Pt desires IOL at 22 1/7 if not del; IOL form completed. - Urinalysis (Urine Dip)  2. Drug use affecting pregnancy, antepartum Pt denies recent use; brings copy of certificate of completion of treatment program (copy made for chart). Agrees to UDS today. - [id number] 7+Oxycodone-Bund   Term labor symptoms and general obstetric precautions including but not limited to vaginal bleeding, contractions, leaking of fluid and fetal movement were reviewed in detail with the patient. Please refer to After Visit Summary for other counseling recommendations.  Return in about 1 week (around 07/13/2022) for Routine prenatal care.  Future Appointments  Date Time Provider [place]  07/13/2022  1:00 PM [facility] PROVIDER [facility] None    Lora Havens, PA-C

## 2022-07-06 NOTE — Progress Notes (Addendum)
Verified has [name] contact card. Initial BP = 140/90. Repeated 5 minutes later 133/92. [name], RN Client brought with her a letter from [place] stating that she has successfully completed her program and she requested letter be placed in her chart. Letter labeled and sent for scanning. [name], RN Urine dip reviewed by A. Streilein PA-C (normal result) and aware of repeat BP of 143/101. Per Ms. Bailey Davis, client referred now to [name] for evaluation.[name], RN [name] IOL referral and snapshot pages faxed to [name], [name] scheduler. Fax confirmation received. Phone call to [name] and requested information left on her voicemail regarding IOL appt. Number for [name] to call provided. [name].

## 2022-07-08 DIAGNOSIS — O0993 Supervision of high risk pregnancy, unspecified, third trimester: Secondary | ICD-10-CM

## 2022-07-11 ENCOUNTER — Telehealth: Payer: Self-pay

## 2022-07-11 LAB — 789231 7+OXYCODONE-BUND
BENZODIAZ UR QL: NEGATIVE ng/mL
Barbiturate screen, urine: NEGATIVE ng/mL
Cannabinoid Quant, Ur: NEGATIVE ng/mL
Cocaine (Metab.): NEGATIVE ng/mL
OPIATE SCREEN URINE: NEGATIVE ng/mL
Oxycodone/Oxymorphone, Urine: NEGATIVE ng/mL
PCP Quant, Ur: NEGATIVE ng/mL

## 2022-07-11 LAB — AMPHETAMINE CONF, UR: Amphetamines: NEGATIVE

## 2022-07-11 NOTE — Telephone Encounter (Signed)
Follow-up call to message left 07/06/2022 on voicemail of [name], [place] scheduler regarding IOL appt for client. Message left with requested information and number to call provided. Rich Number, RN

## 2022-07-11 NOTE — Telephone Encounter (Signed)
Per [place], client delivered 07/09/2022. Return call to [place], [place] scheduler and left message apologizing for call as per [facility], client delivered 07/09/2022. Rich Number, RN

## 2022-07-12 ENCOUNTER — Telehealth: Payer: Self-pay

## 2022-07-12 NOTE — Addendum Note (Signed)
Addended by: Sherolyn Trettin on: 07/12/2022 12:44 PM   Modules accepted: Orders  

## 2022-07-12 NOTE — Telephone Encounter (Signed)
TC to patient to remind her of appointment tomorrow at 1:00pm, counseled to arrive at 12:45pm. Needs PP BP check tomorrow and then to schedule another BP check the following week. Patient will need to be overbooked next week as there are currently no appointments. Ola Spurr, CNM aware. LM with number to call.Jenetta Downer, RN

## 2022-07-13 ENCOUNTER — Ambulatory Visit: Payer: Medicaid Other

## 2022-07-13 ENCOUNTER — Ambulatory Visit: Payer: Medicaid Other | Admitting: Physician Assistant

## 2022-07-13 DIAGNOSIS — R030 Elevated blood-pressure reading, without diagnosis of hypertension: Secondary | ICD-10-CM

## 2022-07-13 NOTE — Progress Notes (Addendum)
Patient here for PP BP check. SVD on 07/08/22. Discharged with procardia, taking once daily, 60mg . Baby's name is Bailey Davis. BP today is 146/91, HR 137.Marland KitchenMarland KitchenMarland KitchenJenetta Downer, RN

## 2022-07-13 NOTE — Telephone Encounter (Signed)
Patient here for appointment today.Jenetta Downer, RN

## 2022-07-13 NOTE — Progress Notes (Signed)
S: Pt presents for BP check S/P NSVD at [redacted]w[redacted]d on 07/08/22 after admission for IOL for pre-eclampsia. Had severe range BP readings during hospitalization requiring titration of BP meds (Nifedipine XR 60mg ). Also had polysubstance use during pregnancy. Had 6lb 12.5oz son, who was circumcised and is bottlefeeding. He is with pt today. Pt denies HA, SOB, edema, abdominal pain. Started Nifedipine XL 60mg  yest am as instructed and denies side effects.  O: pleasant smiling young woman gently and securely holding infant. BP 146/91. Heart RRR, Lungs CTA, no pedal, facial or hand edema. A: elevated blood pressure, peripartum P: continue Nifedipine XL 60mg  qam, f/u in 7-10 d for BP check. Seek emergent care if CP, severe HA, edema, SOB, RUQ pain or visual sx. Pt states understanding. Unsure duration of antihypertensive therapy; pending BP readings moving forward, but counseled likely to be able to taper or discontinue if BP normalizes. Plan 6wk PP visit. Lora Havens, PA-C

## 2022-07-24 ENCOUNTER — Telehealth: Payer: Self-pay

## 2022-07-24 NOTE — Telephone Encounter (Signed)
TC to patient to reschedule missed appointment. Patient needs to come in for OB problem visit for BP check. LM with number to call , then tried home number, unable to LM as VM is full.Jenetta Downer, RN

## 2022-07-25 NOTE — Telephone Encounter (Signed)
TC to patient to try to reschedule missed appointment for BP check PP. LM with number to call.Jenetta Downer, RN

## 2022-07-26 NOTE — Telephone Encounter (Signed)
Call to client to reschedule missed post-partum BP check appt. Left message requesting she reschedule appt as important for provider to know how well her BP medication is working. Number to call provided. Rich Number, RN

## 2022-07-31 NOTE — Telephone Encounter (Signed)
Call to client as needs to reschedule missed appt for BP check on 07/24/2022. Call to client and appt for BP check scheduled for tomorrow with arrival time of 1245. Rich Number, RN

## 2022-08-01 ENCOUNTER — Ambulatory Visit: Payer: Medicaid Other | Admitting: Advanced Practice Midwife

## 2022-08-01 VITALS — BP 139/78 | HR 102 | Temp 97.8°F | Wt 146.0 lb

## 2022-08-01 DIAGNOSIS — Z3400 Encounter for supervision of normal first pregnancy, unspecified trimester: Secondary | ICD-10-CM

## 2022-08-01 NOTE — Progress Notes (Signed)
26 yo SWF here for pp BP check. S:IOL with SVD 07/08/22 at 39 2/7 M 6#12 and severe range BP with Nifedipine XR 60 mg and d/c home with Procardia XL 60 mg q am started on 07/13/22. Denies h/a, scotoma, epigastric pain. Bottlefeeding baby q 3-4 hours (4 oz) and has been to peds x 2 (07/14/22 and 07/24/22). PGM helps with baby. Living with PGM, PGF.  O: BP 139/78 A/P: has pp apt 08/15/2021. Taking Procardia XL 60 mg daily.  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 Continue with Procardia daily

## 2022-08-01 NOTE — Progress Notes (Signed)
Bottle feeding. Denies headaches or visual changes. BThiele RN

## 2022-08-01 NOTE — Progress Notes (Signed)
Postpartum BP Check

## 2022-08-15 ENCOUNTER — Telehealth: Payer: Self-pay

## 2022-08-15 ENCOUNTER — Ambulatory Visit: Payer: Medicaid Other

## 2022-08-15 NOTE — Telephone Encounter (Signed)
[facility] 08/15/2022 for post-partum with Nexplanon insertion. Call to client and left message she call and reschedule appt. Reminded client to notify scheduler that also needs an appt for her Nexplanon insertion with the post-partum appt. Number to call provided.

## 2022-08-17 NOTE — Telephone Encounter (Signed)
Call to client and left message to call and reschedule missed post-partum appt. Number to call provided. Jossie Ng, RN

## 2022-08-21 NOTE — Telephone Encounter (Signed)
Call to client to reschedule missed post-partum appt and left message with number to call. Call to client's home number and per recorded message, voicemail box is full. Call to emergency contact (grandmother) and no voicemail / no answer. Call to emergency contact (dad) and per recorded message, number is not available. Jossie Ng, RN

## 2022-08-21 NOTE — Telephone Encounter (Signed)
Return call from [facility], one of client's emergency contacts, stating had received a call from this (clinic) phone number. RN counseled Ms. Mayford Knife that call was an attempt to contact client regarding scheduling post-partum appt. Per Ms. Mayford Knife, she will try to contact client and ask her to schedule appt. Jossie Ng, RN

## 2022-08-29 NOTE — Telephone Encounter (Signed)
Call to client and left message requesting she call and reschedule missed post-partum appt. Left message that we could provide her birth control at exam and if she did not want to initiate a method, a post-partum exam was still needed and could be done. Number to call for appt left on message.  As multiple unsuccessful attempts to contact client, phone encounter closed. Secure chat regarding above sent to K. Brewer-Jensen RN, [facility] Coordinator. Jossie Ng, RN

## 2023-04-15 LAB — PANORAMA PRENATAL TEST FULL PANEL:PANORAMA TEST PLUS 5 ADDITIONAL MICRODELETIONS: FETAL FRACTION: 13.3
